# Patient Record
Sex: Female | Born: 1960 | Race: White | Hispanic: No | Marital: Married | Smoking: Current every day smoker
Health system: Southern US, Community
[De-identification: ages and names within clinical notes are randomized; demographics above are authoritative.]

## PROBLEM LIST (undated history)

## (undated) DIAGNOSIS — D069 Carcinoma in situ of cervix, unspecified: Secondary | ICD-10-CM

## (undated) DIAGNOSIS — I10 Essential (primary) hypertension: Secondary | ICD-10-CM

## (undated) DIAGNOSIS — K219 Gastro-esophageal reflux disease without esophagitis: Secondary | ICD-10-CM

## (undated) DIAGNOSIS — D649 Anemia, unspecified: Secondary | ICD-10-CM

## (undated) HISTORY — DX: Essential (primary) hypertension: I10

## (undated) HISTORY — DX: Carcinoma in situ of cervix, unspecified: D06.9

## (undated) HISTORY — PX: APPENDECTOMY: SHX54

## (undated) HISTORY — DX: Anemia, unspecified: D64.9

## (undated) HISTORY — DX: Gastro-esophageal reflux disease without esophagitis: K21.9

---

## 2008-07-26 ENCOUNTER — Emergency Department: Payer: Self-pay | Admitting: Internal Medicine

## 2009-10-29 ENCOUNTER — Emergency Department: Payer: Self-pay | Admitting: Emergency Medicine

## 2012-02-13 ENCOUNTER — Emergency Department: Payer: Self-pay | Admitting: Emergency Medicine

## 2012-02-13 LAB — BASIC METABOLIC PANEL
Calcium, Total: 8.8 mg/dL (ref 8.5–10.1)
Chloride: 108 mmol/L — ABNORMAL HIGH (ref 98–107)
Co2: 22 mmol/L (ref 21–32)
Creatinine: 0.79 mg/dL (ref 0.60–1.30)
EGFR (Non-African Amer.): >60
Potassium: 3.4 mmol/L — ABNORMAL LOW (ref 3.5–5.1)
Sodium: 142 mmol/L (ref 136–145)

## 2012-02-13 LAB — CBC
MCH: 28.1 pg (ref 26.0–34.0)
MCHC: 33.7 g/dL (ref 32.0–36.0)
MCV: 84 fL (ref 80–100)
Platelet: 162 10*3/uL (ref 150–440)
RBC: 4.47 10*6/uL (ref 3.80–5.20)
RDW: 16.2 % — ABNORMAL HIGH (ref 11.5–14.5)

## 2012-02-14 LAB — URINALYSIS, COMPLETE
Bilirubin,UR: NEGATIVE
Glucose,UR: NEGATIVE mg/dL (ref 0–75)
Nitrite: NEGATIVE
Ph: 6 (ref 4.5–8.0)
Protein: 30
WBC UR: 37 /HPF (ref 0–5)

## 2012-06-07 ENCOUNTER — Emergency Department: Payer: Self-pay | Admitting: Unknown Physician Specialty

## 2012-06-07 LAB — CBC
HCT: 36.2 % (ref 35.0–47.0)
HGB: 11.5 g/dL — ABNORMAL LOW (ref 12.0–16.0)
MCHC: 31.8 g/dL — ABNORMAL LOW (ref 32.0–36.0)
Platelet: 262 10*3/uL (ref 150–440)
RBC: 4.45 10*6/uL (ref 3.80–5.20)
RDW: 14.8 % — ABNORMAL HIGH (ref 11.5–14.5)
WBC: 11.6 10*3/uL — ABNORMAL HIGH (ref 3.6–11.0)

## 2012-06-07 LAB — BASIC METABOLIC PANEL
Anion Gap: 8 (ref 7–16)
Calcium, Total: 9.3 mg/dL (ref 8.5–10.1)
Chloride: 102 mmol/L (ref 98–107)
Creatinine: 0.87 mg/dL (ref 0.60–1.30)
EGFR (African American): >60
EGFR (Non-African Amer.): >60
Glucose: 96 mg/dL (ref 65–99)
Osmolality: 267 (ref 275–301)

## 2012-06-07 LAB — CK TOTAL AND CKMB (NOT AT ARMC): CK-MB: <0.5 ng/mL — ABNORMAL LOW (ref 0.5–3.6)

## 2012-07-06 ENCOUNTER — Ambulatory Visit (INDEPENDENT_AMBULATORY_CARE_PROVIDER_SITE_OTHER): Payer: Self-pay | Admitting: Family Medicine

## 2012-07-06 ENCOUNTER — Encounter: Payer: Self-pay | Admitting: Family Medicine

## 2012-07-06 VITALS — BP 140/92 | HR 77 | Temp 98.7°F | Ht 66.0 in | Wt 170.5 lb

## 2012-07-06 DIAGNOSIS — F410 Panic disorder [episodic paroxysmal anxiety] without agoraphobia: Secondary | ICD-10-CM

## 2012-07-06 DIAGNOSIS — I10 Essential (primary) hypertension: Secondary | ICD-10-CM | POA: Insufficient documentation

## 2012-07-06 DIAGNOSIS — F411 Generalized anxiety disorder: Secondary | ICD-10-CM

## 2012-07-06 DIAGNOSIS — G4700 Insomnia, unspecified: Secondary | ICD-10-CM

## 2012-07-06 DIAGNOSIS — F5104 Psychophysiologic insomnia: Secondary | ICD-10-CM | POA: Insufficient documentation

## 2012-07-06 MED ORDER — ATENOLOL 25 MG PO TABS: 25.0000 mg | ORAL_TABLET | Freq: Every day | ORAL | Status: DC

## 2012-07-06 MED ORDER — TRAZODONE HCL 50 MG PO TABS: 25.0000 mg | ORAL_TABLET | Freq: Every evening | ORAL | Status: DC | PRN

## 2012-07-06 NOTE — Progress Notes (Signed)
Subjective:    Patient ID: Carla Weaver, female    DOB: 1960-08-21, 52 y.o.   MRN: [id number]  HPI 52 year old female presents to establish.  Previously seeing Dr. Anner Crete in [place].  Has not had a complete physical in many years.  Has had labs in 05/2012 Total chol 155 LDL 100 HDl 36 Tri 131 A1c 5.7  Cr and LFTs nml  TSH nml Vit 31  Hypertension:  Inadequate control. Recent diagnosis in 03/2012 BP was  180/90. Using medication without problems or lightheadedness: Occ Chest pain with exertion:None Edema:None Short of breath:None Average home BPs:111/74- 144/80 Other issues: She feels that HCTZ is drying out. Frequent urination.  Lisinopril made her throat swell.  Insomnia, chronic : Ambien Anxiety and occ paic attacks. She has been working on  Relaxation and redirection and she has not had a panic attack in 2 weeks.  She feels she has been going through menopause in last few months, less heavy but still once a month. Review of Systems  Constitutional: Negative for fever, fatigue and unexpected weight change.  HENT: Negative for ear pain, congestion, sore throat, sneezing, trouble swallowing and sinus pressure.   Eyes: Negative for pain and itching.  Respiratory: Negative for cough, shortness of breath and wheezing.   Cardiovascular: Negative for chest pain, palpitations and leg swelling.  Gastrointestinal: Negative for nausea, abdominal pain, diarrhea, constipation and blood in stool.  Genitourinary: Negative for dysuria, hematuria, vaginal discharge, difficulty urinating and menstrual problem.  Skin: Negative for rash.  Neurological: Negative for syncope, weakness, light-headedness, numbness and headaches.  Psychiatric/Behavioral: Positive for sleep disturbance. Negative for suicidal ideas, confusion and dysphoric mood. The patient is nervous/anxious.        Objective:   Physical Exam  Constitutional: Vital signs are normal. She appears well-developed and  well-nourished. She is cooperative.  Non-toxic appearance. She does not appear ill. No distress.  HENT:  Head: Normocephalic.  Right Ear: Hearing, tympanic membrane, external ear and ear canal normal.  Left Ear: Hearing, tympanic membrane, external ear and ear canal normal.  Nose: Nose normal.  Eyes: Conjunctivae, EOM and lids are normal. Pupils are equal, round, and reactive to light. No foreign bodies found.  Neck: Trachea normal and normal range of motion. Neck supple. Carotid bruit is not present. No mass and no thyromegaly present.  Cardiovascular: Normal rate, regular rhythm, S1 normal, S2 normal, normal heart sounds and intact distal pulses.  Exam reveals no gallop.   No murmur heard. Pulmonary/Chest: Effort normal and breath sounds normal. No respiratory distress. She has no wheezes. She has no rhonchi. She has no rales.  Abdominal: Soft. Normal appearance and bowel sounds are normal. She exhibits no distension, no fluid wave, no abdominal bruit and no mass. There is no hepatosplenomegaly. There is no tenderness. There is no rebound, no guarding and no CVA tenderness. No hernia.  Genitourinary: Vagina normal and uterus normal. No breast swelling, tenderness, discharge or bleeding. Pelvic exam was performed with patient prone. There is no rash, tenderness or lesion on the right labia. There is no rash, tenderness or lesion on the left labia. Uterus is not enlarged and not tender. Cervix exhibits no motion tenderness, no discharge and no friability. Right adnexum displays no mass, no tenderness and no fullness. Left adnexum displays no mass, no tenderness and no fullness.  Lymphadenopathy:    She has no cervical adenopathy.    She has no axillary adenopathy.  Neurological: She is alert. She has normal strength. No  cranial nerve deficit or sensory deficit.  Skin: Skin is warm, dry and intact. No rash noted.  Psychiatric: Her speech is normal and behavior is normal. Judgment normal. Her mood  appears not anxious. Cognition and memory are normal. She does not exhibit a depressed mood.          Assessment & Plan:

## 2012-07-06 NOTE — Patient Instructions (Addendum)
Schedule CPX in next 1-2 months, no labs prior. Quit smoking. Work on regular exercise. Stop TV in bed. Healthy sleep hygiene. Start atenolol daily, stop HCTZ. Follow BP at home daily.. Call in 1 week with measurements. If after 1 week, you are still having insomnia: can try trazodone for sleep.

## 2012-07-08 NOTE — Assessment & Plan Note (Signed)
Discrete panic attacks.. No clear generalized anxiety per pt report, although insomnia may be due to anxiety.  She has been doing better with panic attacks using behavioral modification. Will hold off on anti-anxiolytic for now. Re-eval in 1 month.

## 2012-07-08 NOTE — Assessment & Plan Note (Signed)
Given SE to HCTZ. Will change to atenolol. Reviewed SE profile.  Follow BP at home. Encouraged exercise, weight loss, healthy eating habits. Encouraged her to quit smoking. She is precontemplative.

## 2012-07-08 NOTE — Assessment & Plan Note (Signed)
She feels this may partially be due to medicaiton. Will try stopping HCTZ. But if not improving try trial of trazodone. Sleep hygeine reviewed and info given.

## 2012-07-16 ENCOUNTER — Telehealth: Payer: Self-pay | Admitting: *Deleted

## 2012-07-16 MED ORDER — TRAZODONE HCL 50 MG PO TABS: 75.0000 mg | ORAL_TABLET | Freq: Every evening | ORAL | Status: DC | PRN

## 2012-07-16 NOTE — Telephone Encounter (Signed)
BP control looks great on atenelol.  We can have her try 75 -100 mg of trazodone at night.   If still having issues with sleep we will need to address possible anxiety issues as discussed at last OV.

## 2012-07-16 NOTE — Telephone Encounter (Signed)
Patient called with bp 131/76    125/72     114/61     107/68      123/64 Patient also asking if she can go up on her trazadone to 75 mg b/c she is still not sleeping

## 2012-07-16 NOTE — Telephone Encounter (Signed)
Sent!

## 2012-07-16 NOTE — Telephone Encounter (Signed)
Patient ask for another rx for the trazadone since she is going up

## 2012-09-02 ENCOUNTER — Other Ambulatory Visit (HOSPITAL_COMMUNITY)
Admission: RE | Admit: 2012-09-02 | Discharge: 2012-09-02 | Disposition: A | Discharge status: Home / Self Care | Payer: Self-pay | Source: Ambulatory Visit | Attending: Family Medicine | Admitting: Family Medicine

## 2012-09-02 ENCOUNTER — Encounter: Payer: Self-pay | Admitting: Family Medicine

## 2012-09-02 ENCOUNTER — Ambulatory Visit (INDEPENDENT_AMBULATORY_CARE_PROVIDER_SITE_OTHER): Payer: Self-pay | Admitting: Family Medicine

## 2012-09-02 VITALS — BP 140/72 | HR 64 | Temp 98.4°F | Ht 66.0 in | Wt 174.2 lb

## 2012-09-02 DIAGNOSIS — R87810 Cervical high risk human papillomavirus (HPV) DNA test positive: Secondary | ICD-10-CM | POA: Insufficient documentation

## 2012-09-02 DIAGNOSIS — I10 Essential (primary) hypertension: Secondary | ICD-10-CM

## 2012-09-02 DIAGNOSIS — R87811 Vaginal high risk human papillomavirus (HPV) DNA test positive: Secondary | ICD-10-CM

## 2012-09-02 DIAGNOSIS — IMO0002 Reserved for concepts with insufficient information to code with codable children: Secondary | ICD-10-CM

## 2012-09-02 DIAGNOSIS — Z01419 Encounter for gynecological examination (general) (routine) without abnormal findings: Secondary | ICD-10-CM | POA: Insufficient documentation

## 2012-09-02 DIAGNOSIS — Z1151 Encounter for screening for human papillomavirus (HPV): Secondary | ICD-10-CM | POA: Insufficient documentation

## 2012-09-02 DIAGNOSIS — Z0000 Encounter for general adult medical examination without abnormal findings: Secondary | ICD-10-CM

## 2012-09-02 DIAGNOSIS — F5104 Psychophysiologic insomnia: Secondary | ICD-10-CM

## 2012-09-02 DIAGNOSIS — G4700 Insomnia, unspecified: Secondary | ICD-10-CM

## 2012-09-02 DIAGNOSIS — F410 Panic disorder [episodic paroxysmal anxiety] without agoraphobia: Secondary | ICD-10-CM

## 2012-09-02 MED ORDER — ATENOLOL 25 MG PO TABS: 25.0000 mg | ORAL_TABLET | Freq: Every day | ORAL | Status: DC

## 2012-09-02 MED ORDER — TRAZODONE HCL 50 MG PO TABS: 75.0000 mg | ORAL_TABLET | Freq: Every evening | ORAL | Status: DC | PRN

## 2012-09-02 NOTE — Patient Instructions (Addendum)
Call to schedule mammogram on your own.  Follow up in 1 year for CPX.

## 2012-09-02 NOTE — Progress Notes (Signed)
Subjective:    Patient ID: Carla Weaver, female    DOB: 1960/06/18, 52 y.o.   MRN: [id number]  HPI  The patient is here for annual wellness exam and preventative care.     Hypertension: Improved control on atenolol  Using medication without problems or lightheadedness:  None Chest pain with exertion:None Edema:None Short of breath:None Average home BPs: 114/78-126/80 Other issues:  Panic attacks: No further panic attacks in last 1-2 month.  no depression. No SI.  Chronic insomnia: On trazodone for sleep at night, this medication is helping significantly.  Feels rested in mornings when she gets up.     Review of Systems  Constitutional: Negative for fever, fatigue and unexpected weight change.  HENT: Negative for ear pain, congestion, sore throat, sneezing, trouble swallowing and sinus pressure.   Eyes: Negative for pain and itching.  Respiratory: Negative for cough, shortness of breath and wheezing.   Cardiovascular: Negative for chest pain, palpitations and leg swelling.  Gastrointestinal: Negative for nausea, abdominal pain, diarrhea, constipation and blood in stool.  Genitourinary: Negative for dysuria, hematuria, vaginal discharge, difficulty urinating and menstrual problem.  Skin: Negative for rash.  Neurological: Negative for syncope, weakness, light-headedness, numbness and headaches.  Psychiatric/Behavioral: Negative for confusion and dysphoric mood. The patient is not nervous/anxious.        Objective:   Physical Exam  Constitutional: Vital signs are normal. She appears well-developed and well-nourished. She is cooperative.  Non-toxic appearance. She does not appear ill. No distress.  HENT:  Head: Normocephalic.  Right Ear: Hearing, tympanic membrane, external ear and ear canal normal.  Left Ear: Hearing, tympanic membrane, external ear and ear canal normal.  Nose: Nose normal.  Eyes: Conjunctivae, EOM and lids are normal. Pupils are equal, round, and  reactive to light. No foreign bodies found.  Neck: Trachea normal and normal range of motion. Neck supple. Carotid bruit is not present. No mass and no thyromegaly present.  Cardiovascular: Normal rate, regular rhythm, S1 normal, S2 normal, normal heart sounds and intact distal pulses.  Exam reveals no gallop.   No murmur heard. Pulmonary/Chest: Effort normal and breath sounds normal. No respiratory distress. She has no wheezes. She has no rhonchi. She has no rales.  Abdominal: Soft. Normal appearance and bowel sounds are normal. She exhibits no distension, no fluid wave, no abdominal bruit and no mass. There is no hepatosplenomegaly. There is no tenderness. There is no rebound, no guarding and no CVA tenderness. No hernia.  Genitourinary: Uterus normal. No breast swelling, tenderness, discharge or bleeding. Pelvic exam was performed with patient prone. There is no rash, tenderness or lesion on the right labia. There is no rash, tenderness or lesion on the left labia. Uterus is not enlarged and not tender. Cervix exhibits no motion tenderness, no discharge and no friability. Right adnexum displays no mass, no tenderness and no fullness. Left adnexum displays no mass, no tenderness and no fullness. There is bleeding around the vagina. No erythema or tenderness around the vagina. No signs of injury around the vagina.  Currently on menses, vaginal tissue was not well visualized given bleeding.  Lymphadenopathy:    She has no cervical adenopathy.    She has no axillary adenopathy.  Neurological: She is alert. She has normal strength. No cranial nerve deficit or sensory deficit.  Skin: Skin is warm, dry and intact. No rash noted.  Psychiatric: Her speech is normal and behavior is normal. Judgment normal. Her mood appears not anxious. Cognition and memory are normal.  She does not exhibit a depressed mood.          Assessment & Plan:  The patient's preventative maintenance and recommended screening  tests for an annual wellness exam were reviewed in full today. Brought up to date unless services declined.  Counselled on the importance of diet, exercise, and its role in overall health and mortality. The patient's FH and SH was reviewed, including their home life, tobacco status, and drug and alcohol status.    DVE/PAP: due, was last done longtime ago, but has always been nml. Mammo:overdue. Smoker, trying to quit.Marland Kitchen Has decreased to 5 cigs a day.  Plans to try E vapor cigs. No family history of early colon cancer.

## 2012-09-03 NOTE — Assessment & Plan Note (Signed)
Well controlled on trazodone. Refilled today.

## 2012-09-03 NOTE — Assessment & Plan Note (Signed)
Encouraged exercise, weight loss, healthy eating habits. Improved control on atenolol.

## 2012-09-03 NOTE — Assessment & Plan Note (Signed)
Resolved

## 2012-09-10 ENCOUNTER — Telehealth: Payer: Self-pay | Admitting: *Deleted

## 2012-09-10 MED ORDER — SERTRALINE HCL 50 MG PO TABS: 50.0000 mg | ORAL_TABLET | Freq: Every day | ORAL | Status: DC

## 2012-09-10 NOTE — Telephone Encounter (Signed)
Shes fine with it

## 2012-09-10 NOTE — Telephone Encounter (Signed)
I would recommend starting a medication like sertraline 50 mg daily for anxiety.. Takes 3-4 weeks to start working. SE initially in 1 week can be headache, upset stomach.Ma Hillock after getting used to med. Other SE can be decreased desire and possible weight gain... But no tim all people. Let me know if aggreable.. If she wants to discuss different options make appt to be seen.  Let me know if she wants to start sertraline...and  Make 1 month follow up anxiety .

## 2012-09-10 NOTE — Telephone Encounter (Signed)
Patient says that her anxiety is starting to get bad again and wants to know if anything can be called in for this? Please advise.

## 2012-09-13 ENCOUNTER — Encounter: Payer: Self-pay | Admitting: *Deleted

## 2012-09-16 DIAGNOSIS — IMO0002 Reserved for concepts with insufficient information to code with codable children: Secondary | ICD-10-CM | POA: Insufficient documentation

## 2012-09-16 NOTE — Assessment & Plan Note (Signed)
PAP returned high risk.. Pt referred for colposcopy. Called pt on 4/24 to discuss results. Answered all pt questions and explained diagnosis and expected eval/biopsy.

## 2012-09-16 NOTE — Addendum Note (Signed)
Addended by: Kerby Nora E on: 09/16/2012 05:25 PM   Modules accepted: Orders, Level of Service

## 2012-10-05 ENCOUNTER — Encounter: Payer: Self-pay | Admitting: Obstetrics and Gynecology

## 2012-10-12 ENCOUNTER — Ambulatory Visit (INDEPENDENT_AMBULATORY_CARE_PROVIDER_SITE_OTHER): Payer: Self-pay | Admitting: Obstetrics & Gynecology

## 2012-10-12 ENCOUNTER — Encounter: Payer: Self-pay | Admitting: Obstetrics & Gynecology

## 2012-10-12 VITALS — BP 143/69 | HR 55 | Ht 66.0 in | Wt 172.0 lb

## 2012-10-12 DIAGNOSIS — R87811 Vaginal high risk human papillomavirus (HPV) DNA test positive: Secondary | ICD-10-CM

## 2012-10-12 DIAGNOSIS — Z01812 Encounter for preprocedural laboratory examination: Secondary | ICD-10-CM

## 2012-10-12 DIAGNOSIS — IMO0002 Reserved for concepts with insufficient information to code with codable children: Secondary | ICD-10-CM

## 2012-10-12 LAB — POCT URINE PREGNANCY: Preg Test, Ur: NEGATIVE

## 2012-10-12 NOTE — Progress Notes (Signed)
GYNECOLOGY CLINIC PROCEDURE NOTE  52 y.o. No obstetric history on file. here for colposcopy for ASCUS with POSITIVE high risk HPV pap smear on 09/02/12.Marland Kitchen Discussed role for HPV in cervical dysplasia, need for surveillance.  Patient given informed consent, signed copy in the chart, time out was performed.  Placed in lithotomy position. Cervix viewed with speculum and colposcope after application of acetic acid.   Colposcopy adequate? 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  ECC specimen obtained. All specimens were labelled and sent to pathology.  Patient was given post procedure instructions.  Will follow up pathology and manage accordingly.  Routine preventative health maintenance measures emphasized.

## 2012-10-12 NOTE — Patient Instructions (Signed)

## 2012-10-12 NOTE — Progress Notes (Signed)
Here today for colposcopy, referred by Dr. Ermalene Searing for abnormal pap.

## 2012-10-13 ENCOUNTER — Encounter: Payer: Self-pay | Admitting: Obstetrics & Gynecology

## 2012-10-13 DIAGNOSIS — D069 Carcinoma in situ of cervix, unspecified: Secondary | ICD-10-CM | POA: Insufficient documentation

## 2012-10-13 HISTORY — DX: Carcinoma in situ of cervix, unspecified: D06.9

## 2012-10-14 ENCOUNTER — Other Ambulatory Visit: Payer: Self-pay | Admitting: *Deleted

## 2012-10-14 MED ORDER — TRAZODONE HCL 50 MG PO TABS: 75.0000 mg | ORAL_TABLET | Freq: Every evening | ORAL | Status: DC | PRN

## 2012-10-22 ENCOUNTER — Ambulatory Visit (INDEPENDENT_AMBULATORY_CARE_PROVIDER_SITE_OTHER): Payer: Self-pay | Admitting: Family Medicine

## 2012-10-22 DIAGNOSIS — R300 Dysuria: Secondary | ICD-10-CM

## 2012-10-22 LAB — POCT URINALYSIS DIPSTICK
Urobilinogen, UA: NEGATIVE
pH, UA: 7

## 2012-10-22 NOTE — Progress Notes (Signed)
Patient ID: Carla Weaver, female   DOB: 1960/12/03, 52 y.o.   MRN: [id number]   Patient complains for urinary frequency,dysuria,flank pain. Patient has tried to increase water,drink cranberry juice and also tried 2 over the counter medication with no relief

## 2012-10-25 ENCOUNTER — Telehealth: Payer: Self-pay | Admitting: *Deleted

## 2012-10-25 LAB — URINE CULTURE

## 2012-10-25 MED ORDER — CIPROFLOXACIN HCL 250 MG PO TABS: 250.0000 mg | ORAL_TABLET | Freq: Two times a day (BID) | ORAL | Status: DC

## 2012-10-25 NOTE — Telephone Encounter (Signed)
Message copied by Consuello Masse on Mon Oct 25, 2012  8:09 AM ------      Message from: [place], [place] E      Created: Sun Oct 24, 2012 10:57 PM       Notify pt that bacteria is [present i n urine.       Awaiting sensitivities. If sensitive to cipro... Go ahead and call in cipro 250 BID x 7 days. #14 0 RF.      If not sensitive.. I will address when sensitivities arrive. ------

## 2012-11-02 ENCOUNTER — Encounter: Payer: Self-pay | Admitting: Family Medicine

## 2012-11-02 ENCOUNTER — Ambulatory Visit (INDEPENDENT_AMBULATORY_CARE_PROVIDER_SITE_OTHER): Payer: Self-pay | Admitting: Family Medicine

## 2012-11-02 VITALS — BP 148/77 | HR 72 | Ht 66.0 in | Wt 175.0 lb

## 2012-11-02 DIAGNOSIS — R300 Dysuria: Secondary | ICD-10-CM

## 2012-11-02 DIAGNOSIS — Z01812 Encounter for preprocedural laboratory examination: Secondary | ICD-10-CM

## 2012-11-02 DIAGNOSIS — D069 Carcinoma in situ of cervix, unspecified: Secondary | ICD-10-CM

## 2012-11-02 LAB — POCT URINALYSIS DIPSTICK
Bilirubin, UA: NEGATIVE
Blood, UA: NEGATIVE
Glucose, UA: NEGATIVE
Nitrite, UA: NEGATIVE
pH, UA: 5

## 2012-11-02 NOTE — Patient Instructions (Signed)
Loop Electrosurgical Excision Procedure  Loop electrosurgical excision procedure (LEEP) is the removal of a portion of the lower part of the uterus (cervix). The procedure is done when there are significantly abnormal cervical cell changes. Abnormal cell changes of the cervix can lead to cancer if left in place and untreated.     The LEEP procedure itself typically only takes a few minutes. Often, it may be done in your caregiver's office. The procedure is considered safe for those who wish to get pregnant or are trying to get pregnant. Only under rare circumstances should this procedure be done if you are pregnant.  LET YOUR CAREGIVER KNOW ABOUT:  · Whether you are pregnant or late for your last menstrual period.  · Allergies to foods or medicines.  · All the medicines you are taking including herbs, eyedrops, and over-the-counter medicines, and creams.  · Use of steroids (by mouth or creams).  · Previous problems with anesthetics or numbing medicine.  · Previous gynecological surgery.  · History of blood clots or bleeding problems.  · Any recent or current vaginal infections (herpes, sexually transmitted infections).  · Other health problems.  RISKS AND COMPLICATIONS  · Bleeding.  · Infection.  · Injury to the vagina, bladder, or rectum.  · Very rare obstruction of the cervical opening that causes problems during menstruation (cervical stenosis).  BEFORE THE PROCEDURE  · Do not take aspirin or blood thinners (anticoagulants) for 1 week before the procedure, or as told by your caregiver.  · Eat a light meal before the procedure.  · Ask your caregiver about changing or stopping your regular medicines.  · You may be given a pain reliever 1 or 2 hours before the procedure.  PROCEDURE   · A tool (speculum) is placed in the vagina. This allows your caregiver to see the cervix.  · An iodine stain is applied to the cervix to find the area of abnormal cells to be removed.  · Medicine is injected to numb the cervix (local  anesthetic).    · Electricity is passed through a thin wire loop which is then used to remove (cauterize) a small segment of the affected cervix.  · Light electrocautery is used to seal any small blood vessels and prevent bleeding.  · A paste may be applied to the cauterized area of the cervix to help prevent bleeding.  · The tissue sample is sent to the lab. It is examined under the microscope.  AFTER THE PROCEDURE  · Have someone drive you home.  · You may have slight to moderate cramping.  · You may notice a black vaginal discharge from the paste used on the cervix to prevent bleeding. This is normal.  · Watch for excessive bleeding. This requires immediate medical care.  · Ask when your test results will be ready. Make sure you get your test results.  Document Released: 08/02/2002 Document Revised: 08/04/2011 Document Reviewed: 10/22/2010  [employer]® Patient Information ©2014 [employer].

## 2012-11-02 NOTE — Addendum Note (Signed)
Addended by: Pennie Banter on: 11/02/2012 01:05 PM   Modules accepted: Orders

## 2012-11-02 NOTE — Progress Notes (Signed)
Patient ID: Carla Weaver, female   DOB: 1960-12-29, 52 y.o.   MRN: [id number]  Patient identified, informed consent obtained, signed copy in chart, time out performed.  Pap smear and colposcopy reviewed.   Pap ASC-US + HR HPV Colpo Biopsy CIN 2-3  ECC CIN 2-3 Teflon coated speculum with smoke evacuator placed.  Cervix visualized. Paracervical block placed.  medium size Apple-Fisher LOOP used to remove cone of cervix using blend of cut and cautery on LEEP machine.  Edges/Base cauterized with Ball.  Monsel's solution used for hemostasis.  Patient tolerated procedure well.  Patient given post procedure instructions.  Follow up in 4 months for repeat pap or as needed.

## 2012-11-03 LAB — URINE CULTURE: Organism ID, Bacteria: NO GROWTH

## 2013-07-26 ENCOUNTER — Other Ambulatory Visit: Payer: Self-pay | Admitting: Family Medicine

## 2013-07-26 NOTE — Telephone Encounter (Signed)
Last office visit 0530/2014.  Ok to refill?

## 2013-08-29 ENCOUNTER — Telehealth: Payer: Self-pay | Admitting: Family Medicine

## 2013-08-29 ENCOUNTER — Other Ambulatory Visit (INDEPENDENT_AMBULATORY_CARE_PROVIDER_SITE_OTHER): Payer: 59

## 2013-08-29 DIAGNOSIS — I10 Essential (primary) hypertension: Secondary | ICD-10-CM

## 2013-08-29 LAB — LIPID PANEL
CHOL/HDL RATIO: 4
CHOLESTEROL: 173 mg/dL (ref 0–200)
HDL: 39.3 mg/dL (ref >39.00)
LDL CALC: 107 mg/dL — AB (ref 0–99)
Triglycerides: 132 mg/dL (ref 0.0–149.0)
VLDL: 26.4 mg/dL (ref 0.0–40.0)

## 2013-08-29 LAB — COMPREHENSIVE METABOLIC PANEL
ALK PHOS: 53 U/L (ref 39–117)
ALT: 21 U/L (ref 0–35)
AST: 20 U/L (ref 0–37)
Albumin: 3.7 g/dL (ref 3.5–5.2)
BUN: 14 mg/dL (ref 6–23)
CALCIUM: 8.9 mg/dL (ref 8.4–10.5)
CO2: 24 mEq/L (ref 19–32)
Chloride: 105 mEq/L (ref 96–112)
Creatinine, Ser: 0.9 mg/dL (ref 0.4–1.2)
GFR: 68.6 mL/min (ref >60.00)
Glucose, Bld: 85 mg/dL (ref 70–99)
POTASSIUM: 3.8 meq/L (ref 3.5–5.1)
SODIUM: 137 meq/L (ref 135–145)
TOTAL PROTEIN: 7.1 g/dL (ref 6.0–8.3)
Total Bilirubin: 0.4 mg/dL (ref 0.3–1.2)

## 2013-08-29 NOTE — Telephone Encounter (Signed)
Message copied by Jinny Sanders on Mon Aug 29, 2013  8:35 AM ------      Message from: Ellamae Sia      Created: Fri Aug 19, 2013 12:38 PM      Regarding: Lab orders for Monday 4.6.15       Patient is scheduled for CPX labs, please order future labs, Thanks , Terri       ------

## 2013-08-30 ENCOUNTER — Encounter: Payer: Self-pay | Admitting: *Deleted

## 2013-08-30 ENCOUNTER — Telehealth: Payer: Self-pay | Admitting: Family Medicine

## 2013-08-30 ENCOUNTER — Encounter: Payer: Self-pay | Admitting: Internal Medicine

## 2013-08-30 ENCOUNTER — Ambulatory Visit (INDEPENDENT_AMBULATORY_CARE_PROVIDER_SITE_OTHER): Payer: 59 | Admitting: Internal Medicine

## 2013-08-30 VITALS — BP 144/80 | HR 61 | Temp 97.9°F | Wt 182.0 lb

## 2013-08-30 DIAGNOSIS — N898 Other specified noninflammatory disorders of vagina: Secondary | ICD-10-CM

## 2013-08-30 DIAGNOSIS — N899 Noninflammatory disorder of vagina, unspecified: Secondary | ICD-10-CM

## 2013-08-30 NOTE — Patient Instructions (Addendum)

## 2013-08-30 NOTE — Telephone Encounter (Signed)
Relevant patient education mailed to patient.  

## 2013-08-30 NOTE — Progress Notes (Signed)
Pre visit review using our clinic review tool, if applicable. No additional management support is needed unless otherwise documented below in the visit note. 

## 2013-08-30 NOTE — Progress Notes (Signed)
Subjective:    Patient ID: Carla Weaver, female    DOB: 26-Jul-1960, 53 y.o.   MRN: [id number]  HPI  Pt presents to the clinic today with c/o vaginal discharge and irritation. She noticed this 1 week ago. She describes the discharge as thin and clear. She has noticed some burning on her labia. She denies itching. She does not have any new sexual partners.  Review of Systems      Past Medical History  Diagnosis Date  . Hypertension   . Anemia   . GERD (gastroesophageal reflux disease)   . 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 atenolol (TENORMIN) 25 MG tablet Take 1 tablet (25 mg total) by mouth daily.  90 tablet  3  . traZODone (DESYREL) 50 MG tablet TAKE ONE & ONE-HALF TO TWO TABLETS BY MOUTH AT BEDTIME AS NEEDED FOR SLEEP  60 tablet  0   No current facility-administered medications for this visit.    Allergies  Allergen Reactions  . Sulfa Antibiotics     Family History  Problem Relation Age of Onset  . Hypertension Mother   . Arthritis Mother   . Hypertension Father   . Vision loss Father     galucoma  . Hypertension Sister   . Alcohol abuse Brother   . Cancer Maternal Aunt 33    breast cancer    History   Social History  . Marital Status: Married    Spouse Name: N/A    Number of Children: N/A  . Years of Education: N/A   Occupational History  . Not on file.   Social History Main Topics  . Smoking status: Current Every Day Smoker -- 1.00 packs/day for 34 years    Types: Cigarettes  . Smokeless tobacco: Not on file  . Alcohol Use: No  . Drug Use: No  . Sexual Activity: Not on file   Other Topics Concern  . 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   Exercise: At work.     Diet: decreased portion.    Increased water.           Constitutional: Denies fever, malaise, fatigue, headache or abrupt weight  changes.  GU: Pt reports burning sensation on labia. Denies urgency, frequency, pain with urination, burning sensation, blood in urine, odor or discharge.   No other specific complaints in a complete review of systems (except as listed in HPI above).  Objective:   Physical Exam  BP 144/80  Pulse 61  Temp(Src) 97.9 F (36.6 C) (Oral)  Wt 182 lb (82.555 kg)  SpO2 98% Wt Readings from Last 3 Encounters:  08/30/13 182 lb (82.555 kg)  11/02/12 175 lb (79.379 kg)  10/12/12 172 lb (78.019 kg)    General: Appears her stated age, obese but well developed, well nourished in NAD. Cardiovascular: Normal rate and rhythm. S1,S2 noted.  No murmur, rubs or gallops noted. No JVD or BLE edema. No carotid bruits noted. Pulmonary/Chest: Normal effort and positive vesicular breath sounds. No respiratory distress. No wheezes, rales or ronchi noted.  Abdomen: Soft and nontender. Normal bowel sounds, no bruits noted. No distention or masses noted. Liver, spleen and kidneys non palpable. No CVA tenderness. Pelvic: No redness or erythema noted. No vaginal discharge noted.   BMET    Component Value Date/Time  NA 137 08/29/2013 1417   K 3.8 08/29/2013 1417   CL 105 08/29/2013 1417   CO2 24 08/29/2013 1417   GLUCOSE 85 08/29/2013 1417   BUN 14 08/29/2013 1417   CREATININE 0.9 08/29/2013 1417   CALCIUM 8.9 08/29/2013 1417    Lipid Panel     Component Value Date/Time   CHOL 173 08/29/2013 1417   TRIG 132.0 08/29/2013 1417   HDL 39.30 08/29/2013 1417   CHOLHDL 4 08/29/2013 1417   VLDL 26.4 08/29/2013 1417   LDLCALC 107* 08/29/2013 1417           Assessment & Plan:   Vaginal irritation:  ? Due to her soap Asked her to try dove sensitive Avoid bubble baths Urinalysis: normal Wet Prep: neg for trich, yeast or clue cells  RTC as needed

## 2013-09-01 ENCOUNTER — Encounter: Payer: Self-pay | Admitting: Family Medicine

## 2013-09-01 ENCOUNTER — Ambulatory Visit (INDEPENDENT_AMBULATORY_CARE_PROVIDER_SITE_OTHER): Payer: 59 | Admitting: Family Medicine

## 2013-09-01 ENCOUNTER — Other Ambulatory Visit (HOSPITAL_COMMUNITY)
Admission: RE | Admit: 2013-09-01 | Discharge: 2013-09-01 | Disposition: A | Discharge status: Home / Self Care | Payer: 59 | Source: Ambulatory Visit | Attending: Family Medicine | Admitting: Family Medicine

## 2013-09-01 VITALS — BP 140/80 | HR 68 | Temp 98.2°F | Ht 66.5 in | Wt 180.0 lb

## 2013-09-01 DIAGNOSIS — Z01419 Encounter for gynecological examination (general) (routine) without abnormal findings: Secondary | ICD-10-CM | POA: Insufficient documentation

## 2013-09-01 DIAGNOSIS — Z1211 Encounter for screening for malignant neoplasm of colon: Secondary | ICD-10-CM

## 2013-09-01 DIAGNOSIS — F410 Panic disorder [episodic paroxysmal anxiety] without agoraphobia: Secondary | ICD-10-CM

## 2013-09-01 DIAGNOSIS — IMO0002 Reserved for concepts with insufficient information to code with codable children: Secondary | ICD-10-CM

## 2013-09-01 DIAGNOSIS — F5104 Psychophysiologic insomnia: Secondary | ICD-10-CM

## 2013-09-01 DIAGNOSIS — I10 Essential (primary) hypertension: Secondary | ICD-10-CM

## 2013-09-01 DIAGNOSIS — Z0000 Encounter for general adult medical examination without abnormal findings: Secondary | ICD-10-CM

## 2013-09-01 DIAGNOSIS — N899 Noninflammatory disorder of vagina, unspecified: Secondary | ICD-10-CM

## 2013-09-01 DIAGNOSIS — R6889 Other general symptoms and signs: Secondary | ICD-10-CM

## 2013-09-01 DIAGNOSIS — G4700 Insomnia, unspecified: Secondary | ICD-10-CM

## 2013-09-01 DIAGNOSIS — Z1151 Encounter for screening for human papillomavirus (HPV): Secondary | ICD-10-CM | POA: Insufficient documentation

## 2013-09-01 DIAGNOSIS — D069 Carcinoma in situ of cervix, unspecified: Secondary | ICD-10-CM

## 2013-09-01 DIAGNOSIS — N898 Other specified noninflammatory disorders of vagina: Secondary | ICD-10-CM

## 2013-09-01 MED ORDER — ALPRAZOLAM 0.25 MG PO TABS: 0.2500 mg | ORAL_TABLET | Freq: Every evening | ORAL | Status: DC | PRN

## 2013-09-01 MED ORDER — ATENOLOL 25 MG PO TABS: 25.0000 mg | ORAL_TABLET | Freq: Every day | ORAL | Status: DC

## 2013-09-01 NOTE — Patient Instructions (Addendum)
Start alprazolam at night for sleep, can use occasionally during the day for panic attacks. Limit use as able. Follow up mood in 1 month. Call to schedule mammogram on your own.  Stop at front desk to set up Gu referral for  Colonoscopy.

## 2013-09-01 NOTE — Progress Notes (Signed)
The patient is here for annual wellness exam and preventative care.   Saw  Cecille Po on 4/7. Vaginal irritation externally. Occ thin clear discharge. Urinalysis: normal  Wet Prep: neg for trich, yeast or clue cells.  She is no better now.   Hypertension: Improved control on atenolol  BP Readings from Last 3 Encounters:  09/01/13 140/80  08/30/13 144/80  11/02/12 148/77  Using medication without problems or lightheadedness: None  Chest pain with exertion:None  Edema:None  Short of breath:None  Average home BPs: 116-124/73-78 HR 64 Other issues:   Panic attacks: She has been having 2 panic attacks a week.  Some increase in stress.. Sister with ? Kidney cancer, father with kidney disease due HTN. She has  No depression.  No SI.   Chronic insomnia: On trazodone for sleep at night, this medication was helping, no longer. Wakes up after 2 hours.  If she takes a higher dose of trazodone it last  Too long.. Feels sdedated in mornings when she gets up.   Review of Systems  Constitutional: Negative for fever, fatigue and unexpected weight change.  HENT: Negative for ear pain, congestion, sore throat, sneezing, trouble swallowing and sinus pressure.  Eyes: Negative for pain and itching.  Respiratory: Negative for cough, shortness of breath and wheezing.  Cardiovascular: Negative for chest pain, palpitations and leg swelling.  Gastrointestinal: Negative for nausea, abdominal pain, diarrhea, constipation and blood in stool.  Genitourinary: Negative for dysuria, hematuria, vaginal discharge, difficulty urinating and menstrual problem.  Skin: Negative for rash.  Neurological: Negative for syncope, weakness, light-headedness, numbness and headaches.  Psychiatric/Behavioral: Negative for confusion and dysphoric mood. The patient is not nervous/anxious.  NO SI., no HI. Objective:   Physical Exam  Constitutional: Vital signs are normal. She appears well-developed and well-nourished. She is  cooperative. Non-toxic appearance. She does not appear ill. No distress.  HENT:  Head: Normocephalic.  Right Ear: Hearing, tympanic membrane, external ear and ear canal normal.  Left Ear: Hearing, tympanic membrane, external ear and ear canal normal.  Nose: Nose normal.  Eyes: Conjunctivae, EOM and lids are normal. Pupils are equal, round, and reactive to light. No foreign bodies found.  Neck: Trachea normal and normal range of motion. Neck supple. Carotid bruit is not present. No mass and no thyromegaly present.  Cardiovascular: Normal rate, regular rhythm, S1 normal, S2 normal, normal heart sounds and intact distal pulses. Exam reveals no gallop.  No murmur heard.  Pulmonary/Chest: Effort normal and breath sounds normal. No respiratory distress. She has no wheezes. She has no rhonchi. She has no rales.  Abdominal: Soft. Normal appearance and bowel sounds are normal. She exhibits no distension, no fluid wave, no abdominal bruit and no mass. There is no hepatosplenomegaly. There is no tenderness. There is no rebound, no guarding and no CVA tenderness. No hernia.  Genitourinary: Uterus normal. No breast swelling, tenderness, discharge or bleeding. Pelvic exam was performed with patient prone. There is no rash, tenderness or lesion on the right labia. There is no rash, tenderness or lesion on the left labia. Uterus is not enlarged and not tender. Cervix exhibits no motion tenderness, no discharge and no friability. Right adnexum displays no mass, no tenderness and no fullness. Left adnexum displays no mass, no tenderness and no fullness. No erythema or tenderness around the vagina. No signs of injury around the vagina. Lymphadenopathy:  She has no cervical adenopathy.  She has no axillary adenopathy.  Neurological: She is alert. She has normal strength. No  cranial nerve deficit or sensory deficit.  Skin: Skin is warm, dry and intact. No rash noted.  Psychiatric: Her speech is normal and behavior is  normal. Judgment normal. Her mood appears not anxious. Cognition and memory are normal. She does not exhibit a depressed mood.  Assessment & Plan:   The patient's preventative maintenance and recommended screening tests for an annual wellness exam were reviewed in full today.  Brought up to date unless services declined.  Counselled on the importance of diet, exercise, and its role in overall health and mortality.  The patient's FH and SH was reviewed, including their home life, tobacco status, and drug and alcohol status.   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 " They told me they got it all"  No follow up recommended. Mammo:overdue.  Smoker, trying to quit.Marland Kitchen Has decreased to 3 cigs a day. Considered trying E vapor cigs.  colon cancer screen: due

## 2013-09-01 NOTE — Assessment & Plan Note (Signed)
No sign of infection. Neg wet prep 2 days ago. Not post menopausal. Likely irritant derm.Marland Kitchen Possibly due to detergent change.

## 2013-09-01 NOTE — Assessment & Plan Note (Signed)
Stop trazodone, will use alprazolam for sleep. Follow up in 1 month.

## 2013-09-01 NOTE — Assessment & Plan Note (Signed)
Hopefully situation exacerbation.  Temporary increase in alprazolam.

## 2013-09-01 NOTE — Assessment & Plan Note (Signed)
Well controlled at home.

## 2013-09-01 NOTE — Progress Notes (Signed)
Pre visit review using our clinic review tool, if applicable. No additional management support is needed unless otherwise documented below in the visit note. 

## 2013-09-06 ENCOUNTER — Encounter: Payer: Self-pay | Admitting: *Deleted

## 2013-09-29 ENCOUNTER — Encounter: Payer: Self-pay | Admitting: Internal Medicine

## 2013-09-30 ENCOUNTER — Ambulatory Visit: Payer: Self-pay | Admitting: Family Medicine

## 2013-11-08 ENCOUNTER — Telehealth: Payer: Self-pay | Admitting: Internal Medicine

## 2013-11-08 ENCOUNTER — Ambulatory Visit (AMBULATORY_SURGERY_CENTER): Payer: 59

## 2013-11-08 VITALS — Ht 66.0 in | Wt 184.4 lb

## 2013-11-08 DIAGNOSIS — Z1211 Encounter for screening for malignant neoplasm of colon: Secondary | ICD-10-CM

## 2013-11-08 MED ORDER — NA SULFATE-K SULFATE-MG SULF 17.5-3.13-1.6 GM/177ML PO SOLN: 1.0000 | Freq: Once | ORAL | Status: DC

## 2013-11-08 MED ORDER — SUPREP BOWEL PREP KIT 17.5-3.13-1.6 GM/177ML PO SOLN: 1.0000 | Freq: Once | ORAL | Status: DC

## 2013-11-08 NOTE — Telephone Encounter (Signed)
Pt will come by [facility] 4th floor and pick up sample of Suprep.

## 2013-11-08 NOTE — Progress Notes (Signed)
FYI pt has "tilted colon".  Was told following C-section.  No allergies to eggs or soy No home oxygen No diet/weight loss meds No past problems with anesthesia  No email

## 2013-11-22 ENCOUNTER — Encounter: Payer: Self-pay | Admitting: Internal Medicine

## 2013-11-22 ENCOUNTER — Ambulatory Visit (AMBULATORY_SURGERY_CENTER): Payer: 59 | Admitting: Internal Medicine

## 2013-11-22 VITALS — BP 129/75 | HR 47 | Temp 97.0°F | Resp 16 | Ht 66.0 in | Wt 184.0 lb

## 2013-11-22 DIAGNOSIS — D126 Benign neoplasm of colon, unspecified: Secondary | ICD-10-CM

## 2013-11-22 DIAGNOSIS — Z1211 Encounter for screening for malignant neoplasm of colon: Secondary | ICD-10-CM

## 2013-11-22 DIAGNOSIS — D129 Benign neoplasm of anus and anal canal: Secondary | ICD-10-CM

## 2013-11-22 DIAGNOSIS — D128 Benign neoplasm of rectum: Secondary | ICD-10-CM

## 2013-11-22 MED ORDER — SODIUM CHLORIDE 0.9 % IV SOLN: 500.0000 mL | INTRAVENOUS | Status: DC

## 2013-11-22 NOTE — Progress Notes (Signed)
Called to room to assist during endoscopic procedure.  Patient ID and intended procedure confirmed with present staff. Received instructions for my participation in the procedure from the performing physician.  

## 2013-11-22 NOTE — Patient Instructions (Addendum)
I found and removed 9 small polyps. They all look benign. Will see if they are pre-cancerous. 1 of the 9 did not get picked up.  I will let you know pathology results and when to have another routine colonoscopy by mail.  I appreciate the opportunity to care for you. Gatha Mayer, MD, FACG   YOU HAD AN ENDOSCOPIC PROCEDURE TODAY AT [facility]: Refer to the procedure report that was given to you for any specific questions about what was found during the examination.  If the procedure report does not answer your questions, please call your gastroenterologist to clarify.  If you requested that your care partner not be given the details of your procedure findings, then the procedure report has been included in a sealed envelope for you to review at your convenience later.  YOU SHOULD EXPECT: Some feelings of bloating in the abdomen. Passage of more gas than usual.  Walking can help get rid of the air that was put into your GI tract during the procedure and reduce the bloating. If you had a lower endoscopy (such as a colonoscopy or flexible sigmoidoscopy) you may notice spotting of blood in your stool or on the toilet paper. If you underwent a bowel prep for your procedure, then you may not have a normal bowel movement for a few days.  DIET: Your first meal following the procedure should be a light meal and then it is ok to progress to your normal diet.  A half-sandwich or bowl of soup is an example of a good first meal.  Heavy or fried foods are harder to digest and may make you feel nauseous or bloated.  Likewise meals heavy in dairy and vegetables can cause extra gas to form and this can also increase the bloating.  Drink plenty of fluids but you should avoid alcoholic beverages for 24 hours.  ACTIVITY: Your care partner should take you home directly after the procedure.  You should plan to take it easy, moving slowly for the rest of the day.  You can resume normal activity the day  after the procedure however you should NOT DRIVE or use heavy machinery for 24 hours (because of the sedation medicines used during the test).    SYMPTOMS TO REPORT IMMEDIATELY: A gastroenterologist can be reached at any hour.  During normal business hours, 8:30 AM to 5:00 PM Monday through Friday, call [phone].  After hours and on weekends, please call the GI answering service at [phone] who will take a message and have the physician on call contact you.   Following lower endoscopy (colonoscopy or flexible sigmoidoscopy):  Excessive amounts of blood in the stool  Significant tenderness or worsening of abdominal pains  Swelling of the abdomen that is new, acute  Fever of 100F or higher  FOLLOW UP: If any biopsies were taken you will be contacted by phone or by letter within the next 1-3 weeks.  Call your gastroenterologist if you have not heard about the biopsies in 3 weeks.  Our staff will call the home number listed on your records the next business day following your procedure to check on you and address any questions or concerns that you may have at that time regarding the information given to you following your procedure. This is a courtesy call and so if there is no answer at the home number and we have not heard from you through the emergency physician on call, we will assume that you  have returned to your regular daily activities without incident.  SIGNATURES/CONFIDENTIALITY: You and/or your care partner have signed paperwork which will be entered into your electronic medical record.  These signatures attest to the fact that that the information above on your After Visit Summary has been reviewed and is understood.  Full responsibility of the confidentiality of this discharge information lies with you and/or your care-partner.

## 2013-11-22 NOTE — Progress Notes (Signed)
A/ox3 pleased with MAC, report to Karen RN 

## 2013-11-22 NOTE — Op Note (Signed)
[address] [place], [id number]   COLONOSCOPY PROCEDURE REPORT  PATIENT: Carla Weaver, Carla Weaver  MR#: [id number] BIRTHDATE: 02/14/1961 , 52  yrs. old GENDER: Female ENDOSCOPIST: Gatha Mayer, MD, [facility] REFERRED BY:Amy Cletis [place], M.D. PROCEDURE DATE:  11/22/2013 PROCEDURE:   Colonoscopy with biopsy and snare polypectomy First Screening Colonoscopy - Avg.  risk and is 50 yrs.  old or older Yes.  Prior Negative Screening - Now for repeat screening. N/A  History of Adenoma - Now for follow-up colonoscopy & has been > or = to 3 yrs.  N/A  Polyps Removed Today? Yes. ASA CLASS:   Class II INDICATIONS:average risk screening and first colonoscopy. MEDICATIONS: propofol (Diprivan) 300mg  IV, MAC sedation, administered by CRNA, and These medications were titrated to patient response per physician's verbal order  DESCRIPTION OF PROCEDURE:   After the risks benefits and alternatives of the procedure were thoroughly explained, informed consent was obtained.  A digital rectal exam revealed no abnormalities of the rectum.   The LB [id number]  endoscope was introduced through the anus and advanced to the cecum, which was identified by both the appendix and ileocecal valve. No adverse events experienced.   The quality of the prep was excellent using Suprep  The instrument was then slowly withdrawn as the colon was fully examined.  COLON FINDINGS: Nine diminutive sessile polyps were found at the cecum, in the transverse colon, descending colon, sigmoid colon, and rectum.  A polypectomy was performed with cold forceps and with a cold snare - (either)  The resection was complete and the polyp tissue was partially retrieved.  8 of 9 polyps removed. The colon mucosa was otherwise normal.   A right colon retroflexion was performed.  Retroflexed views revealed no abnormalities. The time to cecum=1 minutes 21 seconds.  Withdrawal time=16 minutes 06 seconds.  The scope was  withdrawn and the procedure completed. COMPLICATIONS: There were no complications.     ENDOSCOPIC IMPRESSION: 1.   Nine diminutive sessile polyps were found at the cecum, in the transverse colon, descending colon, sigmoid colon, and rectum; polypectomy was performed with cold forceps and with a cold snare 1 of the 9 (rectual) polyps was not recovered. 2.   The colon mucosa was otherwise normal - excellent prep - first colonoscopy  RECOMMENDATIONS: Timing of repeat colonoscopy will be determined by pathology findings.   eSigned:  Gatha Mayer, MD, [facility] 11/22/2013 8:44 AM   cc: Jinny Sanders, MD and The Patient   PATIENT NAME:  Carla Weaver, Carla Weaver MR#: [id number]

## 2013-11-23 ENCOUNTER — Telehealth: Payer: Self-pay | Admitting: *Deleted

## 2013-11-23 NOTE — Telephone Encounter (Signed)
  Follow up Call-  Call back number 11/22/2013  Post procedure Call Back phone  # [phone]  Permission to leave phone message Yes     Patient questions:  Do you have a fever, pain , or abdominal swelling? No. Pain Score  0 *  Have you tolerated food without any problems? Yes.    Have you been able to return to your normal activities? Yes.    Do you have any questions about your discharge instructions: Diet   No. Medications  No. Follow up visit  No.  Do you have questions or concerns about your Care? No.  Actions: * If pain score is 4 or above: No action needed, pain <4.

## 2013-11-24 ENCOUNTER — Other Ambulatory Visit: Payer: Self-pay | Admitting: Family Medicine

## 2013-11-24 NOTE — Telephone Encounter (Signed)
Last office visit 09/01/2013.  Not on current medication list.  Refill?

## 2013-11-30 ENCOUNTER — Telehealth: Payer: Self-pay | Admitting: Internal Medicine

## 2013-11-30 NOTE — Telephone Encounter (Signed)
Patient notified that she will be notified by mail of the results of the pathology as soon as Dr. Carlean Purl has reviewed

## 2013-11-30 NOTE — Telephone Encounter (Signed)
Error---pt has 2 MRN. Wrong chart

## 2013-12-01 ENCOUNTER — Encounter: Payer: Self-pay | Admitting: Internal Medicine

## 2014-01-09 ENCOUNTER — Encounter: Payer: Self-pay | Admitting: Family Medicine

## 2014-01-09 ENCOUNTER — Ambulatory Visit (INDEPENDENT_AMBULATORY_CARE_PROVIDER_SITE_OTHER)
Admission: RE | Admit: 2014-01-09 | Discharge: 2014-01-09 | Disposition: A | Discharge status: Home / Self Care | Payer: 59 | Source: Ambulatory Visit | Attending: Family Medicine | Admitting: Family Medicine

## 2014-01-09 ENCOUNTER — Ambulatory Visit (INDEPENDENT_AMBULATORY_CARE_PROVIDER_SITE_OTHER): Payer: 59 | Admitting: Family Medicine

## 2014-01-09 ENCOUNTER — Encounter (INDEPENDENT_AMBULATORY_CARE_PROVIDER_SITE_OTHER): Payer: Self-pay

## 2014-01-09 VITALS — BP 138/74 | HR 62 | Temp 98.4°F | Ht 66.5 in | Wt 183.0 lb

## 2014-01-09 DIAGNOSIS — M25559 Pain in unspecified hip: Secondary | ICD-10-CM

## 2014-01-09 DIAGNOSIS — M25552 Pain in left hip: Secondary | ICD-10-CM

## 2014-01-09 DIAGNOSIS — M5416 Radiculopathy, lumbar region: Secondary | ICD-10-CM

## 2014-01-09 DIAGNOSIS — IMO0002 Reserved for concepts with insufficient information to code with codable children: Secondary | ICD-10-CM

## 2014-01-09 MED ORDER — CYCLOBENZAPRINE HCL 10 MG PO TABS: 5.0000 mg | ORAL_TABLET | Freq: Every day | ORAL | Status: DC

## 2014-01-09 MED ORDER — HYDROCODONE-ACETAMINOPHEN 5-325 MG PO TABS: 1.0000 | ORAL_TABLET | Freq: Four times a day (QID) | ORAL | Status: DC | PRN

## 2014-01-09 MED ORDER — PREDNISONE 20 MG PO TABS: ORAL_TABLET | ORAL | Status: DC

## 2014-01-09 NOTE — Progress Notes (Signed)
01/09/2014    ID: Onna Nodal   MRN: [id number]  DOB: 1961/04/17  Primary Physician:  Eliezer Lofts, MD  Chief Complaint: hip and leg pain  Subjective:   History of Present Illness:  Carla Weaver is a 53 y.o. very pleasant female patient who presents with the following: Back Pain  ongoing for approximately: 6 weeks The patient has had back pain before. The back pain is localized into the lumbar spine area. They also describe L radiculopathy and "hip pain" in the posterior. Unclear if groin pain.  6 weeks. Tylenol, icy hot, heating pad, Capsaicin patches.  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  No numbness or tingling. No bowel or bladder incontinence. No focal weakness. Prior interventions: none Physical therapy: No Chiropractic manipulations: No Acupuncture: No Osteopathic manipulation: No Heat or cold: Minimal effect  Past Medical History, Surgical History, Family History, Medications, Allergies have been reviewed and updated if relevant.  Review of Systems  GEN: No fevers, chills. Nontoxic. Primarily MSK c/o today. MSK: Detailed in the HPI GI: tolerating PO intake without difficulty Neuro: As above  Otherwise the pertinent positives of the ROS are noted above.     Objective:   Physical Exam Filed Vitals:   01/09/14 1407  BP: 138/74  Pulse: 62  Temp: 98.4 F (36.9 C)  TempSrc: Oral  Height: 5' 6.5" (1.689 m)  Weight: 183 lb (83.008 kg)    Gen: Well-developed,well-nourished,in no acute distress; alert,appropriate and cooperative throughout examination HEENT: Normocephalic and atraumatic without obvious abnormalities.  Ears, externally no deformities Pulm: Breathing comfortably in no respiratory distress Range of motion at  the waist: Flexion, rotation and lateral bending: notably restricted in all directions. She has a ways to 50 percent loss of motion in all places.  No echymosis or edema Rises to examination table with no  difficulty Gait: minimally antalgic  Inspection/Deformity: No abnormality Paraspinus T:  Tender to palpation from L2-S1.  B Ankle Dorsiflexion (L5,4): 5/5 B Great Toe Dorsiflexion (L5,4): 5/5 Heel Walk (L5): WNL Toe Walk (S1): WNL Rise/Squat (L4): WNL, mild pain  SENSORY B Medial Foot (L4): WNL B Dorsum (L5): WNL B Lateral (S1): WNL Light Touch: WNL Pinprick: WNL  REFLEXES Knee (L4): 2+ Ankle (S1): 2+  B SLR, seated: POS B SLR, supine: POS B FABER: neg B Reverse FABER: neg B Greater Troch: NT B Log Roll: neg B Stork: NT B Sciatic Notch: NT  Radiology: Dg Lumbar Spine Complete  01/09/2014   CLINICAL DATA:  Back pain.  EXAM: LUMBAR SPINE - COMPLETE 4+ VIEW  COMPARISON:  None.  FINDINGS: No fracture or spondylolisthesis. Mild loss of disc height at L3-L4 with moderate loss of disc height at L4-L5. Remaining lumbar disc spaces are well preserved. Mild facet degenerative changes noted bilaterally at L4-L5 and L5-S1.  Soft tissues are unremarkable.  IMPRESSION: No fracture or acute finding.  Mild degenerative changes.   Electronically Signed   By: Lajean Manes M.D.   On: 01/09/2014 15:18   Dg Hip Complete Left  01/09/2014   CLINICAL DATA:  Hip versus back pain.  Poor range of motion.  EXAM: LEFT HIP - COMPLETE 2+ VIEW  COMPARISON:  None.  FINDINGS: There is no evidence of hip fracture or dislocation. There is no evidence of arthropathy or other focal bone abnormality.  IMPRESSION: Negative.   Electronically Signed   By: Shon Hale M.D.   On: 01/09/2014 15:25    Assessment & Plan:   Lumbar radiculopathy,  acute - Plan: DG Lumbar Spine Complete, DG Hip Complete Left  Left hip pain - Plan: DG Lumbar Spine Complete, DG Hip Complete Left  Probable acute disc herniation. Considerable pain.  Steroids, flexeril at night prn vicodin.   F/u 4 weeks if not fully better  Follow-up: No Follow-up on file.  New Prescriptions   CYCLOBENZAPRINE (FLEXERIL) 10 MG TABLET    Take 0.5-1  tablets (5-10 mg total) by mouth at bedtime.   HYDROCODONE-ACETAMINOPHEN (NORCO/VICODIN) 5-325 MG PER TABLET    Take 1 tablet by mouth every 6 (six) hours as needed for moderate pain.   PREDNISONE (DELTASONE) 20 MG TABLET    2 tab po for 7 days, then 1 tab po for 7 days   Orders Placed This Encounter  Procedures  . DG Lumbar Spine Complete  . DG Hip Complete Left    Signed,  Frederico Hamman T. Merton Wadlow, MD, [facility] [place], [address] Phone: [phone] Fax: [phone]   Patient's Medications  New Prescriptions   CYCLOBENZAPRINE (FLEXERIL) 10 MG TABLET    Take 0.5-1 tablets (5-10 mg total) by mouth at bedtime.   HYDROCODONE-ACETAMINOPHEN (NORCO/VICODIN) 5-325 MG PER TABLET    Take 1 tablet by mouth every 6 (six) hours as needed for moderate pain.   PREDNISONE (DELTASONE) 20 MG TABLET    2 tab po for 7 days, then 1 tab po for 7 days  Previous Medications   ACETAMINOPHEN (TYLENOL) 500 MG TABLET    Take 500 mg by mouth 2 (two) times daily as needed.   ALPRAZOLAM (XANAX) 0.25 MG TABLET    TAKE ONE TABLET BY MOUTH AT BEDTIME AS NEEDED FOR ANXIETY( AND CAN USE OCCASSIONALLY DURING THE DAY FOR PANICK ATTACKS)   ATENOLOL (TENORMIN) 25 MG TABLET    Take 1 tablet (25 mg total) by mouth daily.  Modified Medications   No medications on file  Discontinued Medications   No medications on file

## 2014-01-09 NOTE — Progress Notes (Signed)
Pre visit review using our clinic review tool, if applicable. No additional management support is needed unless otherwise documented below in the visit note. 

## 2014-02-06 ENCOUNTER — Telehealth: Payer: Self-pay

## 2014-02-06 MED ORDER — OXYCODONE-ACETAMINOPHEN 5-325 MG PO TABS: 1.0000 | ORAL_TABLET | ORAL | Status: DC | PRN

## 2014-02-06 NOTE — Telephone Encounter (Signed)
Gay Filler notified prescription for Percocet is ready to be picked up at the front desk.

## 2014-02-06 NOTE — Telephone Encounter (Signed)
Pt was seen 01/09/14 with back pain and lt hip pain; pt has finished taking muscle relaxant and pain med; after laying all night pt can hardly get up in the mornings and takes couple of hours after taking pain med to be able to function without a lot of pain. Pt request stronger pain med.pt scheduled f/u appt on 02/08/14 at 3:30 but in mean time request pain med.Please advise.[facility].

## 2014-02-06 NOTE — Telephone Encounter (Signed)
i am going to give her percocet in the mean time.

## 2014-02-08 ENCOUNTER — Ambulatory Visit (INDEPENDENT_AMBULATORY_CARE_PROVIDER_SITE_OTHER): Payer: 59 | Admitting: Family Medicine

## 2014-02-08 ENCOUNTER — Encounter: Payer: Self-pay | Admitting: Family Medicine

## 2014-02-08 ENCOUNTER — Encounter (INDEPENDENT_AMBULATORY_CARE_PROVIDER_SITE_OTHER): Payer: Self-pay

## 2014-02-08 VITALS — BP 140/70 | HR 73 | Temp 98.3°F | Ht 66.5 in | Wt 186.2 lb

## 2014-02-08 DIAGNOSIS — M5416 Radiculopathy, lumbar region: Secondary | ICD-10-CM

## 2014-02-08 DIAGNOSIS — IMO0002 Reserved for concepts with insufficient information to code with codable children: Secondary | ICD-10-CM

## 2014-02-08 MED ORDER — AMITRIPTYLINE HCL 25 MG PO TABS: 25.0000 mg | ORAL_TABLET | Freq: Every day | ORAL | Status: DC

## 2014-02-08 MED ORDER — OXYCODONE-ACETAMINOPHEN 5-325 MG PO TABS: 1.0000 | ORAL_TABLET | ORAL | Status: DC | PRN

## 2014-02-08 NOTE — Progress Notes (Signed)
02/08/2014    ID: SANDEEP DELAGARZA   MRN: [id number]  DOB: 02/18/1961  Primary Physician:  Eliezer Lofts, MD  Chief Complaint: Follow-up  Subjective:   History of Present Illness:  Carla Weaver is a 53 y.o. very pleasant female patient who presents with the following: Back Pain  F/u L lumbar radiculopathy:  Still all the way down leg. Pain meds only lasting 4 hours.  Picking up eggs for a living.  To recap, the patient has had symptoms now for over 2 months, she is having some left-sided pain in the posterior aspect in her buttocks and her hip, and posteriorlyall the way down her leg. Previously, she did have a markedly abnormal exam an abnormal straight leg raise, which is resolved.  She is still having some considerable pain. She is not having any numbness, no weakness, no footdrop or any other focal weaknesses. She has been able to work. She is taking some pain medication at nighttime only. Right now she is taking Percocet, and that is leaving her symptoms greater than the Vicodin which given originally. She also was on 14 day prednisone taper.  No groin pain.  01/09/2014 Last OV with Owens Loffler, MD  ongoing for approximately: 6 weeks The patient has had back pain before. The back pain is localized into the lumbar spine area. They also describe L radiculopathy and "hip pain" in the posterior. Unclear if groin pain.  6 weeks. Tylenol, icy hot, heating pad, Capsaicin patches.  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  No numbness or tingling. No bowel or bladder incontinence. No focal weakness. Prior interventions: none Physical therapy: No Chiropractic manipulations: No Acupuncture: No Osteopathic manipulation: No Heat or cold: Minimal effect  Past Medical History, Surgical History, Family History, Medications, Allergies have been reviewed and updated if relevant.  Review of Systems  GEN: No fevers, chills. Nontoxic. Primarily MSK c/o today. MSK:  Detailed in the HPI GI: tolerating PO intake without difficulty Neuro: As above  Otherwise the pertinent positives of the ROS are noted above.     Objective:   Physical Exam Filed Vitals:   02/08/14 1519  BP: 140/70  Pulse: 73  Temp: 98.3 F (36.8 C)  TempSrc: Oral  Height: 5' 6.5" (1.689 m)  Weight: 186 lb 4 oz (84.482 kg)    Gen: Well-developed,well-nourished,in no acute distress; alert,appropriate and cooperative throughout examination HEENT: Normocephalic and atraumatic without obvious abnormalities.  Ears, externally no deformities Pulm: Breathing comfortably in no respiratory distress Range of motion at  the waist: Flexion, rotation and lateral bending: mildly restricted in all directions. She has approx 20 percent loss of motion in all places.  No echymosis or edema Rises to examination table with no difficulty Gait: minimally antalgic  Inspection/Deformity: No abnormality Paraspinus T:  Tender to palpation from L2-S1.  B Ankle Dorsiflexion (L5,4): 5/5 B Great Toe Dorsiflexion (L5,4): 5/5 Heel Walk (L5): WNL Toe Walk (S1): WNL Rise/Squat (L4): WNL, mild pain  SENSORY B Medial Foot (L4): WNL B Dorsum (L5): WNL B Lateral (S1): WNL Light Touch: WNL Pinprick: WNL  REFLEXES Knee (L4): 2+ Ankle (S1): 2+  B SLR, seated: neg B SLR, supine: neg B FABER: neg B Reverse FABER: neg B Greater Troch: NT B Log Roll: neg B Stork: NT B Sciatic Notch: NT  Radiology: Dg Lumbar Spine Complete  01/09/2014   CLINICAL DATA:  Back pain.  EXAM: LUMBAR SPINE - COMPLETE 4+ VIEW  COMPARISON:  None.  FINDINGS:  No fracture or spondylolisthesis. Mild loss of disc height at L3-L4 with moderate loss of disc height at L4-L5. Remaining lumbar disc spaces are well preserved. Mild facet degenerative changes noted bilaterally at L4-L5 and L5-S1.  Soft tissues are unremarkable.  IMPRESSION: No fracture or acute finding.  Mild degenerative changes.   Electronically Signed   By: Lajean Manes M.D.   On: 01/09/2014 15:18   Dg Hip Complete Left  01/09/2014   CLINICAL DATA:  Hip versus back pain.  Poor range of motion.  EXAM: LEFT HIP - COMPLETE 2+ VIEW  COMPARISON:  None.  FINDINGS: There is no evidence of hip fracture or dislocation. There is no evidence of arthropathy or other focal bone abnormality.  IMPRESSION: Negative.   Electronically Signed   By: Shon Hale M.D.   On: 01/09/2014 15:25    Assessment & Plan:   Lumbar radiculopathy, acute  Probable acute disc herniation. Considerable pain. SLR is now negative.  S/p steroids Change to TCA at night Percocet for pain prn.  Discussed other options like MRI, ESI, other interventions, and she would like to be conservative for now.  Follow-up: Return in about 6 weeks (around 03/22/2014).  New Prescriptions   AMITRIPTYLINE (ELAVIL) 25 MG TABLET    Take 1 tablet (25 mg total) by mouth at bedtime.   No orders of the defined types were placed in this encounter.    Signed,  Maud Deed. Javier Mamone, MD, [facility] [place], [address] Phone: [phone] Fax: [phone]   Patient's Medications  New Prescriptions   AMITRIPTYLINE (ELAVIL) 25 MG TABLET    Take 1 tablet (25 mg total) by mouth at bedtime.  Previous Medications   ACETAMINOPHEN (TYLENOL) 500 MG TABLET    Take 500 mg by mouth 2 (two) times daily as needed.   ALPRAZOLAM (XANAX) 0.25 MG TABLET    TAKE ONE TABLET BY MOUTH AT BEDTIME AS NEEDED FOR ANXIETY( AND CAN USE OCCASSIONALLY DURING THE DAY FOR PANICK ATTACKS)   ATENOLOL (TENORMIN) 25 MG TABLET    Take 1 tablet (25 mg total) by mouth daily.  Modified Medications   Modified Medication Previous Medication   OXYCODONE-ACETAMINOPHEN (PERCOCET/ROXICET) 5-325 MG PER TABLET oxyCODONE-acetaminophen (PERCOCET/ROXICET) 5-325 MG per tablet      Take 1 tablet by mouth every 4 (four) hours as needed.    Take 1 tablet by mouth every 4  (four) hours as needed.  Discontinued Medications   CYCLOBENZAPRINE (FLEXERIL) 10 MG TABLET    Take 0.5-1 tablets (5-10 mg total) by mouth at bedtime.   HYDROCODONE-ACETAMINOPHEN (NORCO/VICODIN) 5-325 MG PER TABLET    Take 1 tablet by mouth every 6 (six) hours as needed for moderate pain.   PREDNISONE (DELTASONE) 20 MG TABLET    2 tab po for 7 days, then 1 tab po for 7 days

## 2014-02-08 NOTE — Progress Notes (Signed)
Pre visit review using our clinic review tool, if applicable. No additional management support is needed unless otherwise documented below in the visit note. 

## 2014-03-24 ENCOUNTER — Ambulatory Visit: Payer: 59 | Admitting: Family Medicine

## 2014-03-29 ENCOUNTER — Ambulatory Visit: Payer: 59 | Admitting: Family Medicine

## 2014-10-06 ENCOUNTER — Other Ambulatory Visit: Payer: Self-pay | Admitting: Family Medicine

## 2014-10-06 NOTE — Telephone Encounter (Signed)
Last office visit 02/08/2014 with Dr. Lorelei Pont for back pain.  Last CPE 09/01/2013. Ok to refill?

## 2014-10-06 NOTE — Telephone Encounter (Signed)
Needs CPX, refill until appt made.

## 2014-10-06 NOTE — Telephone Encounter (Signed)
Pt left v/m requesting status of atenolol refill.

## 2014-10-07 NOTE — Telephone Encounter (Signed)
Please schedule CPE with fasting labs prior with Dr. Bedsole.  

## 2014-11-22 ENCOUNTER — Encounter: Payer: Self-pay | Admitting: Urgent Care

## 2014-11-22 ENCOUNTER — Emergency Department
Admission: EM | Admit: 2014-11-22 | Discharge: 2014-11-22 | Disposition: A | Discharge status: Home / Self Care | Payer: 59 | Attending: Emergency Medicine | Admitting: Emergency Medicine

## 2014-11-22 DIAGNOSIS — M5432 Sciatica, left side: Secondary | ICD-10-CM

## 2014-11-22 DIAGNOSIS — Z79899 Other long term (current) drug therapy: Secondary | ICD-10-CM | POA: Diagnosis not present

## 2014-11-22 DIAGNOSIS — M545 Low back pain: Secondary | ICD-10-CM | POA: Diagnosis present

## 2014-11-22 DIAGNOSIS — Z720 Tobacco use: Secondary | ICD-10-CM | POA: Diagnosis not present

## 2014-11-22 DIAGNOSIS — M5442 Lumbago with sciatica, left side: Secondary | ICD-10-CM | POA: Diagnosis not present

## 2014-11-22 DIAGNOSIS — R51 Headache: Secondary | ICD-10-CM | POA: Diagnosis not present

## 2014-11-22 DIAGNOSIS — I10 Essential (primary) hypertension: Secondary | ICD-10-CM | POA: Insufficient documentation

## 2014-11-22 LAB — URINALYSIS COMPLETE WITH MICROSCOPIC (ARMC ONLY)
BILIRUBIN URINE: NEGATIVE
Bacteria, UA: NONE SEEN
GLUCOSE, UA: NEGATIVE mg/dL
HGB URINE DIPSTICK: NEGATIVE
KETONES UR: NEGATIVE mg/dL
Leukocytes, UA: NEGATIVE
NITRITE: NEGATIVE
Protein, ur: NEGATIVE mg/dL
Specific Gravity, Urine: 1.012 (ref 1.005–1.030)
pH: 5 (ref 5.0–8.0)

## 2014-11-22 MED ORDER — DIAZEPAM 5 MG PO TABS: 5.0000 mg | ORAL_TABLET | Freq: Two times a day (BID) | ORAL | Status: DC | PRN

## 2014-11-22 MED ORDER — OXYCODONE-ACETAMINOPHEN 5-325 MG PO TABS: 1.0000 | ORAL_TABLET | Freq: Four times a day (QID) | ORAL | Status: DC | PRN

## 2014-11-22 MED ORDER — NAPROXEN 250 MG PO TABS: 250.0000 mg | ORAL_TABLET | Freq: Two times a day (BID) | ORAL | Status: DC

## 2014-11-22 NOTE — ED Provider Notes (Signed)
[facility] Emergency Department Provider Note  ____________________________________________  Time seen: 7:15 AM  I have reviewed the triage vital signs and the nursing notes.   HISTORY  Chief Complaint Back Pain and Facial Pain    HPI Carla Weaver is a 54 y.o. female who complains of left lower back pain radiating down the left leg for 3 days. She first noticed it upon awakening Monday morning 2 days ago. She denies any prior injuries such as chronic back pain or significant previous back injuries. No known herniated disks. Over last 3-4 days, she cannot recall any specific incidents that might have injured or strained her back. No chip slips or falls. No motor vehicle collisions. No lifting of heavy things. No sudden onset of pain or popping or tearing. She denies any lower external weakness numbness or paresthesia. No bowel or bladder incontinence or retention. No fevers or chills. No previous spinal surgeries. Never Had pain like this before.   The pain is in the left lower back, radiating down the left leg. It is sharp and burning. It is worse with movement and change of position and better with lying still. No associated symptoms. Severe in intensity.       Past Medical History  Diagnosis Date  . Hypertension   . Anemia   . GERD (gastroesophageal reflux disease)   . 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 Vaginal irritation 09/01/2013  .  HX of Severe dysplasia of cervix (CIN III) 10/13/2012  . HTN (hypertension), benign 07/06/2012  . Chronic insomnia 07/06/2012  . Panic attacks 07/06/2012    Past Surgical History  Procedure Laterality Date  . Cesarean section    . Appendectomy      Current Outpatient Rx  Name  Route  Sig  Dispense  Refill  . atenolol (TENORMIN) 25 MG tablet   Oral   Take 25 mg by mouth at bedtime.         . diazepam (VALIUM) 5 MG  tablet   Oral   Take 1 tablet (5 mg total) by mouth every 12 (twelve) hours as needed for muscle spasms.   6 tablet   0   . naproxen (NAPROSYN) 250 MG tablet   Oral   Take 1 tablet (250 mg total) by mouth 2 (two) times daily with a meal.   40 tablet   0   . oxyCODONE-acetaminophen (ROXICET) 5-325 MG per tablet   Oral   Take 1 tablet by mouth every 6 (six) hours as needed for severe pain.   8 tablet   0     Allergies Sulfa antibiotics and Ampicillin  Family History  Problem Relation Age of Onset  . Hypertension Mother   . Arthritis Mother   . Hypertension Father   . Vision loss Father     galucoma  . Hypertension Sister   . Alcohol abuse Brother   . Cancer Maternal Aunt 70    breast cancer  . Colon cancer Neg Hx   . Pancreatic cancer Neg Hx   . Stomach cancer Neg Hx     Social History History  Substance Use Topics  . Smoking status: Current Every Day Smoker -- 0.03 packs/day for 34 years    Types: Cigarettes  . Smokeless tobacco: Never Used  . Alcohol Use: No    Review of Systems  Constitutional: No fever or chills.  No weight changes Eyes:No blurry vision or double vision.  ENT: No sore throat. Cardiovascular: No chest pain. Respiratory: No dyspnea or cough. Gastrointestinal: Negative for abdominal pain, vomiting and diarrhea.  No BRBPR or melena. Genitourinary: Negative for dysuria, urinary retention, bloody urine, or difficulty urinating. MusculoskeletalPositive acute back pain as above skin: Negative for rash. Neurological: Negative for headaches, focal weakness or numbness. Psychiatric:No anxiety or depression.   Endocrine:No hot/cold intolerance, changes in energy, or sleep difficulty.  10-point ROS otherwise negative.  ____________________________________________   PHYSICAL EXAM:  VITAL SIGNS: ED Triage Vitals  Enc Vitals Group     BP 11/22/14 0618 176/91 mmHg     Pulse Rate 11/22/14 0618 52     Resp 11/22/14 0618 16     Temp 11/22/14  0618 97.3 F (36.3 C)     Temp Source 11/22/14 0618 Oral     SpO2 11/22/14 0618 99 %     Weight 11/22/14 0618 180 lb (81.647 kg)     Height 11/22/14 0618 5\' 6"  (1.676 m)     Head Cir --      Peak Flow --      Pain Score 11/22/14 0618 8     Pain Loc --      Pain Edu? --      Excl. in [place]? --      Constitutional: Alert and oriented. Well appearing and in no distress. Eyes: No scleral icterus. No conjunctival pallor. PERRL. EOMI ENT   Head: Normocephalic and atraumatic.   Nose: No congestion/rhinnorhea. No septal hematoma   Mouth/Throat: MMM, no pharyngeal erythema. No peritonsillar mass. No uvula shift.   Neck: No stridor. No SubQ emphysema. No meningismus. Hematological/Lymphatic/Immunilogical: No cervical lymphadenopathy. Cardiovascular: RRR. Normal and symmetric distal pulses are present in all extremities. No murmurs, rubs, or gallops. Respiratory: Normal respiratory effort without tachypnea nor retractions. Breath sounds are clear and equal bilaterally. No wheezes/rales/rhonchi. Gastrointestinal: Soft and nontender. No distention. There is no CVA tenderness.  No rebound, rigidity, or guarding. Genitourinary: deferred Musculoskeletal: Nontender with normal range of motion in all extremities. No joint effusions.  No lower extremity tenderness.  No edema. Straight leg raise negative on the right leg, straight leg raise positive at 10-15 on the left leg which reproduces her symptoms of left lower back pain radiating down the left leg.  There is no midline spinal tenderness in the CT or L-spine. In the lower back, there is significant tenseness and tenderness of the paraspinous musculature on the left.  Neurologic:   Normal speech and language.  CN 2-10 normal. Motor grossly intact. No pronator drift.  Normal gait. No gross focal neurologic deficits are appreciated.  EHL positive  Skin:  Skin is warm, dry and intact. No rash noted.  No petechiae, purpura, or  bullae. Psychiatric: Mood and affect are normal. Speech and behavior are normal. Patient exhibits appropriate insight and judgment.  ____________________________________________    LABS (pertinent positives/negatives) (all labs ordered are listed, but only abnormal results are displayed) Labs Reviewed  URINALYSIS COMPLETEWITH MICROSCOPIC ([place] ONLY) - Abnormal; Notable for the following:    Color, Urine YELLOW (*)    APPearance CLEAR (*)    Squamous Epithelial / LPF 0-5 (*)    All other components within normal limits   ____________________________________________   EKG    ____________________________________________    RADIOLOGY    ____________________________________________   PROCEDURES  ____________________________________________   INITIAL IMPRESSION / ASSESSMENT AND PLAN / ED COURSE  Pertinent labs & imaging results  that were available during my care of the patient were reviewed by me and considered in my medical decision making (see chart for details).  Patient examination confirms sciatica which is described of the patient's symptoms. No evidence of spinal cord impairment, cauda equina, epidural abscess or hematoma. No evidence of infection. There are no neurologic deficits. We'll give the patient naproxen twice a day for pain control as well as Valium and Percocet for breakthrough. She is instructed to follow up with primary care in about a week. I gave her instructions on back stretching and exercises as well as counseled her on the dangers of narcotic medications.   ____________________________________________   FINAL CLINICAL IMPRESSION(S) / ED DIAGNOSES  Final diagnoses:  Sciatica associated with disorder of lumbar spine, left      Carrie Mew, MD 11/22/14 [phone]

## 2014-11-22 NOTE — Discharge Instructions (Signed)
Back Exercises Back exercises help treat and prevent back injuries. The goal of back exercises is to increase the strength of your abdominal and back muscles and the flexibility of your back. These exercises should be started when you no longer have back pain. Back exercises include:  Pelvic Tilt. Lie on your back with your knees bent. Tilt your pelvis until the lower part of your back is against the floor. Hold this position 5 to 10 sec and repeat 5 to 10 times.  Knee to Chest. Pull first 1 knee up against your chest and hold for 20 to 30 seconds, repeat this with the other knee, and then both knees. This may be done with the other leg straight or bent, whichever feels better.  Sit-Ups or Curl-Ups. Bend your knees 90 degrees. Start with tilting your pelvis, and do a partial, slow sit-up, lifting your trunk only 30 to 45 degrees off the floor. Take at least 2 to 3 seconds for each sit-up. Do not do sit-ups with your knees out straight. If partial sit-ups are difficult, simply do the above but with only tightening your abdominal muscles and holding it as directed.  Hip-Lift. Lie on your back with your knees flexed 90 degrees. Push down with your feet and shoulders as you raise your hips a couple inches off the floor; hold for 10 seconds, repeat 5 to 10 times.  Back arches. Lie on your stomach, propping yourself up on bent elbows. Slowly press on your hands, causing an arch in your low back. Repeat 3 to 5 times. Any initial stiffness and discomfort should lessen with repetition over time.  Shoulder-Lifts. Lie face down with arms beside your body. Keep hips and torso pressed to floor as you slowly lift your head and shoulders off the floor. Do not overdo your exercises, especially in the beginning. Exercises may cause you some mild back discomfort which lasts for a few minutes; however, if the pain is more severe, or lasts for more than 15 minutes, do not continue exercises until you see your caregiver.  Improvement with exercise therapy for back problems is slow.  See your caregivers for assistance with developing a proper back exercise program. Document Released: 06/19/2004 Document Revised: 08/04/2011 Document Reviewed: 03/13/2011 [facility] Patient Information 2015 [place], [place]. This information is not intended to replace advice given to you by your health care provider. Make sure you discuss any questions you have with your health care provider.  Sciatica Sciatica is pain, weakness, numbness, or tingling along the path of the sciatic nerve. The nerve starts in the lower back and runs down the back of each leg. The nerve controls the muscles in the lower leg and in the back of the knee, while also providing sensation to the back of the thigh, lower leg, and the sole of your foot. Sciatica is a symptom of another medical condition. For instance, nerve damage or certain conditions, such as a herniated disk or bone spur on the spine, pinch or put pressure on the sciatic nerve. This causes the pain, weakness, or other sensations normally associated with sciatica. Generally, sciatica only affects one side of the body. CAUSES   Herniated or slipped disc.  Degenerative disk disease.  A pain disorder involving the narrow muscle in the buttocks (piriformis syndrome).  Pelvic injury or fracture.  Pregnancy.  Tumor (rare). SYMPTOMS  Symptoms can vary from mild to very severe. The symptoms usually travel from the low back to the buttocks and down the back of  the leg. Symptoms can include:  Mild tingling or dull aches in the lower back, leg, or hip.  Numbness in the back of the calf or sole of the foot.  Burning sensations in the lower back, leg, or hip.  Sharp pains in the lower back, leg, or hip.  Leg weakness.  Severe back pain inhibiting movement. These symptoms may get worse with coughing, sneezing, laughing, or prolonged sitting or standing. Also, being overweight may worsen  symptoms. DIAGNOSIS  Your caregiver will perform a physical exam to look for common symptoms of sciatica. He or she may ask you to do certain movements or activities that would trigger sciatic nerve pain. Other tests may be performed to find the cause of the sciatica. These may include:  Blood tests.  X-rays.  Imaging tests, such as an MRI or CT scan. TREATMENT  Treatment is directed at the cause of the sciatic pain. Sometimes, treatment is not necessary and the pain and discomfort goes away on its own. If treatment is needed, your caregiver may suggest:  Over-the-counter medicines to relieve pain.  Prescription medicines, such as anti-inflammatory medicine, muscle relaxants, or narcotics.  Applying heat or ice to the painful area.  Steroid injections to lessen pain, irritation, and inflammation around the nerve.  Reducing activity during periods of pain.  Exercising and stretching to strengthen your abdomen and improve flexibility of your spine. Your caregiver may suggest losing weight if the extra weight makes the back pain worse.  Physical therapy.  Surgery to eliminate what is pressing or pinching the nerve, such as a bone spur or part of a herniated disk. HOME CARE INSTRUCTIONS   Only take over-the-counter or prescription medicines for pain or discomfort as directed by your caregiver.  Apply ice to the affected area for 20 minutes, 3-4 times a day for the first 48-72 hours. Then try heat in the same way.  Exercise, stretch, or perform your usual activities if these do not aggravate your pain.  Attend physical therapy sessions as directed by your caregiver.  Keep all follow-up appointments as directed by your caregiver.  Do not wear high heels or shoes that do not provide proper support.  Check your mattress to see if it is too soft. A firm mattress may lessen your pain and discomfort. SEEK IMMEDIATE MEDICAL CARE IF:   You lose control of your bowel or bladder  (incontinence).  You have increasing weakness in the lower back, pelvis, buttocks, or legs.  You have redness or swelling of your back.  You have a burning sensation when you urinate.  You have pain that gets worse when you lie down or awakens you at night.  Your pain is worse than you have experienced in the past.  Your pain is lasting longer than 4 weeks.  You are suddenly losing weight without reason. MAKE SURE YOU:  Understand these instructions.  Will watch your condition.  Will get help right away if you are not doing well or get worse. Document Released: 05/06/2001 Document Revised: 11/11/2011 Document Reviewed: 09/21/2011 [facility] Patient Information 2015 [place], [place]. This information is not intended to replace advice given to you by your health care provider. Make sure you discuss any questions you have with your health care provider.    You were prescribed a medication that is potentially sedating. Do not drink alcohol, drive or participate in any other potentially dangerous activities while taking this medication as it may make you sleepy. Do not take this medication with any  other sedating medications, either prescription or over-the-counter. If you were prescribed Percocet or Vicodin, do not take these with acetaminophen (Tylenol) as it is already contained within these medications.   Opioid pain medications (or "narcotics") can be habit forming.  Use it as little as possible to achieve adequate pain control.  Do not use or use it with extreme caution if you have a history of opiate abuse or dependence.  If you are on a pain contract with your primary care doctor or a pain specialist, be sure to let them know you were prescribed this medication today from the [facility] Emergency Department.  This medication is intended for your use only - do not give any to anyone else and keep it in a secure place where nobody else, especially children and pets, have access to  it.  It will also cause or worsen constipation, so you may want to consider taking an over-the-counter stool softener while you are taking this medication.

## 2014-11-22 NOTE — ED Notes (Signed)
Pt sitting up on side of stretcher in exam room with no distress noted; pt reports since Monday having lower back pain radiating down left leg; denies hx of same, denies any known injury; denies any accomp symptoms; taking arthritis tylenol without relief; pain currently 8/10

## 2014-11-22 NOTE — ED Notes (Signed)
Patient presents with c/o LEFT side facial pain and LEFT lower back pain since Monday. Patient states, "I have been taking Tylenol, but it aint working."

## 2014-11-22 NOTE — ED Notes (Signed)
Pt signed esignature d/c document. Computer locked up. On re-start document was not saved. Pt had already left facility. Charge nurse [employer] notified.

## 2014-12-05 ENCOUNTER — Ambulatory Visit (INDEPENDENT_AMBULATORY_CARE_PROVIDER_SITE_OTHER)
Admission: RE | Admit: 2014-12-05 | Discharge: 2014-12-05 | Disposition: A | Discharge status: Home / Self Care | Payer: 59 | Source: Ambulatory Visit | Attending: Family Medicine | Admitting: Family Medicine

## 2014-12-05 ENCOUNTER — Ambulatory Visit (INDEPENDENT_AMBULATORY_CARE_PROVIDER_SITE_OTHER): Payer: 59 | Admitting: Family Medicine

## 2014-12-05 ENCOUNTER — Encounter: Payer: Self-pay | Admitting: Family Medicine

## 2014-12-05 VITALS — BP 158/78 | HR 54 | Temp 97.6°F | Ht 66.5 in | Wt 178.5 lb

## 2014-12-05 DIAGNOSIS — M5410 Radiculopathy, site unspecified: Secondary | ICD-10-CM

## 2014-12-05 MED ORDER — CYCLOBENZAPRINE HCL 10 MG PO TABS: 10.0000 mg | ORAL_TABLET | Freq: Every evening | ORAL | Status: DC | PRN

## 2014-12-05 MED ORDER — OXYCODONE-ACETAMINOPHEN 5-325 MG PO TABS: 1.0000 | ORAL_TABLET | Freq: Three times a day (TID) | ORAL | Status: DC | PRN

## 2014-12-05 MED ORDER — DICLOFENAC SODIUM 75 MG PO TBEC: 75.0000 mg | DELAYED_RELEASE_TABLET | Freq: Two times a day (BID) | ORAL | Status: DC
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
## 2014-12-05 NOTE — Patient Instructions (Signed)
We will call you with X-ray results.  Stop naprosyn.  Start instead diclofenac twice daily for inflammation.  Can use percocet for break through pain.  Use cyclobenzaprine at bedtime for muscle relaxant if needed. Continue home PT as much as possible.  Follow up  in 2  weeks if no better.

## 2014-12-05 NOTE — Progress Notes (Signed)
Pre visit review using our clinic review tool, if applicable. No additional management support is needed unless otherwise documented below in the visit note. 
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
## 2014-12-18 ENCOUNTER — Other Ambulatory Visit: Payer: Self-pay | Admitting: Family Medicine

## 2014-12-19 ENCOUNTER — Ambulatory Visit: Payer: 59 | Admitting: Family Medicine

## 2015-01-02 ENCOUNTER — Ambulatory Visit: Payer: 59 | Admitting: Family Medicine

## 2015-03-05 ENCOUNTER — Encounter: Payer: Self-pay | Admitting: Family Medicine

## 2015-03-05 ENCOUNTER — Ambulatory Visit (INDEPENDENT_AMBULATORY_CARE_PROVIDER_SITE_OTHER): Payer: 59 | Admitting: Family Medicine

## 2015-03-05 VITALS — BP 150/78 | HR 61 | Temp 97.5°F | Ht 66.5 in | Wt 182.5 lb

## 2015-03-05 DIAGNOSIS — N949 Unspecified condition associated with female genital organs and menstrual cycle: Secondary | ICD-10-CM | POA: Diagnosis not present

## 2015-03-05 DIAGNOSIS — R300 Dysuria: Secondary | ICD-10-CM | POA: Diagnosis not present

## 2015-03-05 DIAGNOSIS — N898 Other specified noninflammatory disorders of vagina: Secondary | ICD-10-CM

## 2015-03-05 LAB — POCT URINALYSIS DIP (MANUAL ENTRY)
BILIRUBIN UA: NEGATIVE
Bilirubin, UA: NEGATIVE
Blood, UA: NEGATIVE
Glucose, UA: NEGATIVE
Leukocytes, UA: NEGATIVE
Nitrite, UA: NEGATIVE
Protein Ur, POC: NEGATIVE
Spec Grav, UA: 1.015
UROBILINOGEN UA: 0.2
pH, UA: 6

## 2015-03-05 LAB — POCT WET + KOH PREP
TRICH BY WET PREP: ABSENT
YEAST BY KOH: ABSENT
Yeast by wet prep: ABSENT

## 2015-03-05 MED ORDER — NITROFURANTOIN MONOHYD MACRO 100 MG PO CAPS: 100.0000 mg | ORAL_CAPSULE | Freq: Two times a day (BID) | ORAL | Status: DC

## 2015-03-05 NOTE — Progress Notes (Signed)
Dr. Frederico Hamman T. Chellie Vanlue, MD, [facility] [place], [address] Phone: [phone] Fax: [phone]  03/05/2015  Patient: Carla Weaver, MRN: [id number], DOB: 1960-11-28, 54 y.o.  Primary Physician:  Eliezer Lofts, MD  Chief Complaint: Dysuria and Bump on Labia  Subjective:   Carla Weaver is a 54 y.o. very pleasant female patient who presents with the following:  Bump on labia, dysuria. She comes in with 2 primary problems.  She is having dysuria and urgency.  She has been taking AZO for the last several days.  She also has a lesion on the right side of her genitals which is painful to touch.  It is somewhat elevated.  Has been taking cranberry pills and spot on her cheek of her right bottom.  AZO.   Breaking out in a sweat, then will go away.   No recent intercourse. At least 2 years. No known h/o HSV.     Past Medical History, Surgical History, Social History, Family History, Problem List, Medications, and Allergies have been reviewed and updated if relevant.  Patient Active Problem List   Diagnosis Date Noted  . Acute low back pain with radicular symptoms, duration less than 6 weeks 12/05/2014  . Vaginal irritation 09/01/2013  .  HX of Severe dysplasia of cervix (CIN III) 10/13/2012  . HTN (hypertension), benign 07/06/2012  . Chronic insomnia 07/06/2012  . Panic attacks 07/06/2012    Past Medical History  Diagnosis Date  . Hypertension   . Anemia   . GERD (gastroesophageal reflux disease)   . Severe dysplasia of cervix (CIN III) 10/13/2012    Needs LEEP    Past Surgical History  Procedure Laterality Date  . Cesarean section    . Appendectomy      Social History   Social History  . Marital Status: Married    Spouse Name: N/A  . Number of Children: N/A  . Years of Education: N/A   Occupational History  . Not on file.   Social History Main Topics  . Smoking status: Former Smoker -- 0.03  packs/day for 34 years    Types: Cigarettes  . Smokeless tobacco: Never Used     Comment: stop 8 months ago  . Alcohol Use: No  . Drug Use: No  . Sexual Activity: Not on file   Other Topics Concern  . 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   Exercise: At work.     Diet: decreased portion.    Increased water.          Family History  Problem Relation Age of Onset  . Hypertension Mother   . Arthritis Mother   . Hypertension Father   . Vision loss Father     galucoma  . Hypertension Sister   . Alcohol abuse Brother   . Cancer Maternal Aunt 70    breast cancer  . Colon cancer Neg Hx   . Pancreatic cancer Neg Hx   . Stomach cancer Neg Hx     Allergies  Allergen Reactions  . Sulfa Antibiotics   . Ampicillin Rash  . Prednisone Anxiety    Medication list reviewed and updated in full in [facility].  ROS: GEN: Acute illness details above GI: Tolerating PO intake GU: maintaining adequate hydration and urination Pulm: No SOB Interactive and getting along well at home.  Otherwise,  ROS is as per the HPI.   Objective:   BP 150/78 mmHg  Pulse 61  Temp(Src) 97.5 F (36.4 C) (Oral)  Ht 5' 6.5" (1.689 m)  Wt 182 lb 8 oz (82.781 kg)  BMI 29.02 kg/m2  GEN: WDWN, NAD, Non-toxic, A & O x 3 HEENT: Atraumatic, Normocephalic. Neck supple. No masses, No LAD. Ears and Nose: No external deformity. CV: RRR, No M/G/R. No JVD. No thrill. No extra heart sounds. PULM: CTA B, no wheezes, crackles, rhonchi. No retractions. No resp. distress. No accessory muscle use. ABD: S, NT, ND, + BS, No rebound, No HSM  EXTR: No c/c/e NEURO Normal gait.  PSYCH: Normally interactive. Conversant. Not depressed or anxious appearing.  Calm demeanor.   GU: normally exterior, R side of upper inner buttock, distal to labia majora. Elevated with small degree of ulceration and TTP. This portion of the physical examination was chaperoned by  Hedy Camara, CMA.   Laboratory and Imaging Data: Results for orders placed or performed in visit on 03/05/15  POCT urinalysis dipstick  Result Value Ref Range   Color, UA yellow yellow   Clarity, UA clear clear   Glucose, UA negative negative   Bilirubin, UA negative negative   Ketones, POC UA negative negative   Spec Grav, UA 1.015    Blood, UA negative negative   pH, UA 6.0    Protein Ur, POC negative negative   Urobilinogen, UA 0.2    Nitrite, UA Negative Negative   Leukocytes, UA Negative Negative  POCT Wet + KOH Prep ([facility])  Result Value Ref Range   Yeast by KOH Absent Present, Absent   Yeast by wet prep Absent Present, Absent   WBC by wet prep Few None, Few   Clue Cells Wet Prep HPF POC None None   Trich by wet prep Absent Present, Absent   Bacteria Wet Prep HPF POC Moderate (A) None, Few   Epithelial Cells By [employer] Pref ([facility]) Moderate (A) None, Few   RBC,UR,HPF,POC None None RBC/hpf     Assessment and Plan:   Dysuria - Plan: POCT urinalysis dipstick, CANCELED: Urine culture  Genital lesion, female - Plan: Herpes simplex virus culture  Vaginal discharge - Plan: POCT Wet + KOH Prep ([facility])  Presumptive UTI clinically. On AZO, UA limited utility. Treat and wait culture.   Check hsv culture May be boil only  Follow-up: No Follow-up on file.  New Prescriptions   NITROFURANTOIN, MACROCRYSTAL-MONOHYDRATE, (MACROBID) 100 MG CAPSULE    Take 1 capsule (100 mg total) by mouth 2 (two) times daily.   Orders Placed This Encounter  Procedures  . Herpes simplex virus culture  . POCT urinalysis dipstick  . POCT Wet + KOH Prep ([facility])    Signed,  Raneisha Bress T. Palmer Shorey, MD   Patient's Medications  New Prescriptions   NITROFURANTOIN, MACROCRYSTAL-MONOHYDRATE, (MACROBID) 100 MG CAPSULE    Take 1 capsule (100 mg total) by mouth 2 (two) times daily.  Previous Medications   ATENOLOL (TENORMIN) 25 MG TABLET    TAKE ONE TABLET BY MOUTH ONCE DAILY  Modified Medications   No  medications on file  Discontinued Medications   CYCLOBENZAPRINE (FLEXERIL) 10 MG TABLET    Take 1 tablet (10 mg total) by mouth at bedtime as needed for muscle spasms.   DICLOFENAC (VOLTAREN) 75 MG EC TABLET    Take 1 tablet (75 mg total) by mouth 2 (two) times daily.   OXYCODONE-ACETAMINOPHEN (ROXICET) 5-325 MG PER TABLET    Take 1  tablet by mouth every 8 (eight) hours as needed for severe pain.

## 2015-03-05 NOTE — Progress Notes (Signed)
Pre visit review using our clinic review tool, if applicable. No additional management support is needed unless otherwise documented below in the visit note. 

## 2015-03-06 LAB — URINE CULTURE: Colony Count: 5000

## 2015-03-07 LAB — URINE CULTURE
Colony Count: NO GROWTH
Organism ID, Bacteria: NO GROWTH

## 2015-03-07 LAB — HERPES SIMPLEX VIRUS CULTURE: ORGANISM ID, BACTERIA: NOT DETECTED

## 2015-03-22 ENCOUNTER — Telehealth: Payer: Self-pay | Admitting: Family Medicine

## 2015-03-22 NOTE — Telephone Encounter (Signed)
Lab 1/18 cpx 1/24 Pt aware Please close

## 2015-03-22 NOTE — Telephone Encounter (Signed)
Please call and schedule CPE with fasting labs prior for Dr. Diona Browner.  Needs office visit prior to any more refills.

## 2015-05-10 ENCOUNTER — Encounter: Payer: Self-pay | Admitting: Family Medicine

## 2015-05-10 ENCOUNTER — Telehealth: Payer: Self-pay | Admitting: Family Medicine

## 2015-05-10 ENCOUNTER — Ambulatory Visit (INDEPENDENT_AMBULATORY_CARE_PROVIDER_SITE_OTHER): Payer: 59 | Admitting: Family Medicine

## 2015-05-10 VITALS — BP 140/72 | HR 60 | Temp 98.7°F | Ht 66.5 in | Wt 176.8 lb

## 2015-05-10 DIAGNOSIS — I10 Essential (primary) hypertension: Secondary | ICD-10-CM

## 2015-05-10 DIAGNOSIS — Z1159 Encounter for screening for other viral diseases: Secondary | ICD-10-CM

## 2015-05-10 DIAGNOSIS — J209 Acute bronchitis, unspecified: Secondary | ICD-10-CM

## 2015-05-10 MED ORDER — GUAIFENESIN-CODEINE 100-10 MG/5ML PO SYRP: 5.0000 mL | ORAL_SOLUTION | Freq: Every evening | ORAL | Status: DC | PRN

## 2015-05-10 MED ORDER — AZITHROMYCIN 250 MG PO TABS: ORAL_TABLET | ORAL | Status: DC

## 2015-05-10 NOTE — Patient Instructions (Addendum)
Complete antibiotics.  Can use cough suppressant at night.   We will see you next week!  Call sooner if fever on antibiotics, Go to ER if severe shortness of breath.

## 2015-05-10 NOTE — Assessment & Plan Note (Signed)
>   1 month of cough in smoker. Treat with antibiotics. If not improving eval with X-ray.

## 2015-05-10 NOTE — Progress Notes (Signed)
Pre visit review using our clinic review tool, if applicable. No additional management support is needed unless otherwise documented below in the visit note. 

## 2015-05-10 NOTE — Telephone Encounter (Signed)
At time of labs..Please let pt know that I have added hep C testing to their routine labs given CDC recommends screening anyone born between 1945-1965 (higher risk population for various reasons) given it is a dormant virus (for 20-30 years) that later can cause liver cancer and liver cirrhosis. Covered by insurance.  If pt refuses, or has had in past or would to discuss further...please cancel and notify me.   

## 2015-05-10 NOTE — Progress Notes (Signed)
   Subjective:    Patient ID: Carla Weaver, female    DOB: [birth year], 54 y.o.   MRN: [id number]  Cough This is a new problem. The current episode started more than 1 month ago (1.5 months). The problem has been gradually worsening. The cough is productive of sputum. Associated symptoms include nasal congestion, shortness of breath and wheezing. Pertinent negatives include no ear congestion, ear pain, fever, headaches, myalgias or sore throat. Associated symptoms comments: Chest congestion  chest heavy with congestion at night. The symptoms are aggravated by lying down. Risk factors: former smoker, quit 10 months ago, 35 pyr history. Treatments tried: mucinex and coricidin. The treatment provided mild relief. There is no history of asthma, bronchiectasis, COPD, emphysema, environmental allergies or pneumonia.    Social History /Family History/Past Medical History reviewed and updated if needed. NO COPD, no asthma, never had spirometry in past.   Review of Systems  Constitutional: Negative for fever.  HENT: Negative for ear pain and sore throat.   Respiratory: Positive for cough, shortness of breath and wheezing.   Musculoskeletal: Negative for myalgias.  Allergic/Immunologic: Negative for environmental allergies.  Neurological: Negative for headaches.       Objective:   Physical Exam  Constitutional: Vital signs are normal. She appears well-developed and well-nourished. She is cooperative.  Non-toxic appearance. She does not appear ill. No distress.  HENT:  Head: Normocephalic.  Right Ear: Hearing, tympanic membrane, external ear and ear canal normal. Tympanic membrane is not erythematous, not retracted and not bulging.  Left Ear: Hearing, tympanic membrane, external ear and ear canal normal. Tympanic membrane is not erythematous, not retracted and not bulging.  Nose: Mucosal edema and rhinorrhea present. Right sinus exhibits no maxillary sinus tenderness and no frontal sinus  tenderness. Left sinus exhibits no maxillary sinus tenderness and no frontal sinus tenderness.  Mouth/Throat: Uvula is midline, oropharynx is clear and moist and mucous membranes are normal.  Eyes: Conjunctivae, EOM and lids are normal. Pupils are equal, round, and reactive to light. Lids are everted and swept, no foreign bodies found.  Neck: Trachea normal and normal range of motion. Neck supple. Carotid bruit is not present. No thyroid mass and no thyromegaly present.  Cardiovascular: Normal rate, regular rhythm, S1 normal, S2 normal, normal heart sounds, intact distal pulses and normal pulses.  Exam reveals no gallop and no friction rub.   No murmur heard. Pulmonary/Chest: Effort normal and breath sounds normal. No tachypnea. No respiratory distress. She has no decreased breath sounds. She has no wheezes. She has no rhonchi. She has no rales.  Neurological: She is alert.  Skin: Skin is warm, dry and intact. No rash noted.  Psychiatric: Her speech is normal and behavior is normal. Judgment normal. Her mood appears not anxious. Cognition and memory are normal. She does not exhibit a depressed mood.          Assessment & Plan:

## 2015-05-10 NOTE — Telephone Encounter (Signed)
-----   Message from Ellamae Sia sent at 05/02/2015  5:02 PM EST ----- Regarding: lab orders for Friday, 12.16.16 Patient is scheduled for CPX labs, please order future labs, Thanks , Karna Christmas

## 2015-05-11 ENCOUNTER — Other Ambulatory Visit: Payer: 59

## 2015-05-11 ENCOUNTER — Other Ambulatory Visit: Payer: Self-pay | Admitting: Family Medicine

## 2015-05-11 ENCOUNTER — Other Ambulatory Visit (INDEPENDENT_AMBULATORY_CARE_PROVIDER_SITE_OTHER): Payer: 59

## 2015-05-11 DIAGNOSIS — Z1159 Encounter for screening for other viral diseases: Secondary | ICD-10-CM

## 2015-05-11 DIAGNOSIS — I10 Essential (primary) hypertension: Secondary | ICD-10-CM | POA: Diagnosis not present

## 2015-05-11 LAB — LIPID PANEL
Cholesterol: 185 mg/dL (ref 0–200)
HDL: 38.5 mg/dL — ABNORMAL LOW (ref >39.00)
LDL CALC: 119 mg/dL — AB (ref 0–99)
NONHDL: 146.92
Total CHOL/HDL Ratio: 5
Triglycerides: 140 mg/dL (ref 0.0–149.0)
VLDL: 28 mg/dL (ref 0.0–40.0)

## 2015-05-11 LAB — COMPREHENSIVE METABOLIC PANEL
ALBUMIN: 4.1 g/dL (ref 3.5–5.2)
ALK PHOS: 55 U/L (ref 39–117)
ALT: 14 U/L (ref 0–35)
AST: 19 U/L (ref 0–37)
BUN: 10 mg/dL (ref 6–23)
CO2: 29 mEq/L (ref 19–32)
Calcium: 9.6 mg/dL (ref 8.4–10.5)
Chloride: 104 mEq/L (ref 96–112)
Creatinine, Ser: 1.06 mg/dL (ref 0.40–1.20)
GFR: 57.87 mL/min — ABNORMAL LOW (ref >60.00)
GLUCOSE: 74 mg/dL (ref 70–99)
Potassium: 3.7 mEq/L (ref 3.5–5.1)
SODIUM: 139 meq/L (ref 135–145)
TOTAL PROTEIN: 7.4 g/dL (ref 6.0–8.3)
Total Bilirubin: 0.4 mg/dL (ref 0.2–1.2)

## 2015-05-12 LAB — HEPATITIS C ANTIBODY: HCV Ab: NEGATIVE

## 2015-05-18 ENCOUNTER — Ambulatory Visit (INDEPENDENT_AMBULATORY_CARE_PROVIDER_SITE_OTHER): Payer: 59 | Admitting: Family Medicine

## 2015-05-18 ENCOUNTER — Encounter: Payer: Self-pay | Admitting: Family Medicine

## 2015-05-18 VITALS — BP 148/76 | HR 55 | Temp 98.4°F | Ht 67.5 in | Wt 176.0 lb

## 2015-05-18 DIAGNOSIS — Z0000 Encounter for general adult medical examination without abnormal findings: Secondary | ICD-10-CM

## 2015-05-18 DIAGNOSIS — Z23 Encounter for immunization: Secondary | ICD-10-CM | POA: Diagnosis not present

## 2015-05-18 DIAGNOSIS — I10 Essential (primary) hypertension: Secondary | ICD-10-CM

## 2015-05-18 MED ORDER — ATENOLOL 25 MG PO TABS: 25.0000 mg | ORAL_TABLET | Freq: Every day | ORAL | Status: DC

## 2015-05-18 NOTE — Assessment & Plan Note (Signed)
Well controlled at home on atenolol daily, may have white coat HTN.

## 2015-05-18 NOTE — Patient Instructions (Addendum)
Keep kidneys happy.. Avoid aleve, advil. Drink lots of water.  Make sure Blood pressure at home stays less than 140./90  Exchange animal fats for veggies fats.  Increase exercise if able.  Call to set up mammogram on your own.

## 2015-05-18 NOTE — Progress Notes (Signed)
The patient is here for annual wellness exam and preventative care.   Treated for bronchitis on 12/15 with Zpack She reports she is much improved cough. No SOB.  Hypertension: BP elevated today and last OV  on atenolol  She feels due to white coat HTN given good control at home. BP Readings from Last 3 Encounters:  05/18/15 148/76  05/10/15 140/72  03/05/15 150/78  Using medication without problems or lightheadedness: None  Chest pain with exertion:None  Edema:None  Short of breath:None  Average home BPs: 124/73-78 HR 64 Other issues:   Panic attacks/generalized anxiety:  A lot of stress but coping better on no ,med. No depression.  No SI.   Chronic insomnia: Improved some on no medication. She is tolerating well.  Exercise: walking 1-2 miles a day  Diet: moderate, some fried foods, trying to improve diet.  Reviewed labs in detail.  Review of Systems  Constitutional: Negative for fever, fatigue and unexpected weight change.  HENT: Negative for ear pain, congestion, sore throat, sneezing, trouble swallowing and sinus pressure.  Eyes: Negative for pain and itching.  Respiratory: Negative for cough, shortness of breath and wheezing.  Cardiovascular: Negative for chest pain, palpitations and leg swelling.  Gastrointestinal: Negative for nausea, abdominal pain, diarrhea, constipation and blood in stool.  Genitourinary: Negative for dysuria, hematuria, vaginal discharge, difficulty urinating and menstrual problem.  Skin: Negative for rash.  Neurological: Negative for syncope, weakness, light-headedness, numbness and headaches.  Psychiatric/Behavioral: Negative for confusion and dysphoric mood. The patient is not nervous/anxious. NO SI., no HI. Objective:   Physical Exam  Constitutional: Vital signs are normal. She appears well-developed and well-nourished. She is cooperative. Non-toxic appearance. She does not appear ill. No distress.  HENT:  Head:  Normocephalic.  Right Ear: Hearing, tympanic membrane, external ear and ear canal normal.  Left Ear: Hearing, tympanic membrane, external ear and ear canal normal.  Nose: Nose normal.  Eyes: Conjunctivae, EOM and lids are normal. Pupils are equal, round, and reactive to light. No foreign bodies found.  Neck: Trachea normal and normal range of motion. Neck supple. Carotid bruit is not present. No mass and no thyromegaly present.  Cardiovascular: Normal rate, regular rhythm, S1 normal, S2 normal, normal heart sounds and intact distal pulses. Exam reveals no gallop.  No murmur heard.  Pulmonary/Chest: Effort normal and breath sounds normal. No respiratory distress. She has no wheezes. She has no rhonchi. She has no rales.  Abdominal: Soft. Normal appearance and bowel sounds are normal. She exhibits no distension, no fluid wave, no abdominal bruit and no mass. There is no hepatosplenomegaly. There is no tenderness. There is no rebound, no guarding and no CVA tenderness. No hernia.  Genitourinary: Uterus normal. No breast swelling, tenderness, discharge or bleeding. Pelvic exam was performed with patient prone. There is no rash, tenderness or lesion on the right labia. There is no rash, tenderness or lesion on the left labia. Uterus is not enlarged and not tender. Cervix exhibits no motion tenderness, no discharge and no friability. Right adnexum displays no mass, no tenderness and no fullness. Left adnexum displays no mass, no tenderness and no fullness. No erythema or tenderness around the vagina. No signs of injury around the vagina. Lymphadenopathy:  She has no cervical adenopathy.  She has no axillary adenopathy.  Neurological: She is alert. She has normal strength. No cranial nerve deficit or sensory deficit.  Skin: Skin is warm, dry and intact. No rash noted.  Psychiatric: Her speech is normal and behavior  is normal. Judgment normal. Her mood appears not anxious. Cognition and memory are  normal. She does not exhibit a depressed mood.  Assessment & Plan:   The patient's preventative maintenance and recommended screening tests for an annual wellness exam were reviewed in full today.  Brought up to date unless services declined.  Counselled on the importance of diet, exercise, and its role in overall health and mortality.  The patient's FH and SH was reviewed, including their home life, tobacco status, and drug and alcohol status.   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 " They told me they got it all" No follow up recommended. . 08/2013 here: nml, no high risk HPV seen. Repeat pap in 3 years Mammo:overdue.  Former smoker, quit 8 months ago!! colon cancer screen: 10/2013 colonscopy, Carlean Purl, low risk polyps, plan repeat in 10 years.  Hep C neg  Vaccines: due for flu (refused), Tdap   HIV; refused

## 2015-05-18 NOTE — Addendum Note (Signed)
Addended by: [place] Lions on: 05/18/2015 04:29 PM   Modules accepted: Orders

## 2015-05-18 NOTE — Progress Notes (Signed)
Pre visit review using our clinic review tool, if applicable. No additional management support is needed unless otherwise documented below in the visit note. 

## 2015-05-29 ENCOUNTER — Telehealth: Payer: Self-pay | Admitting: Family Medicine

## 2015-05-29 NOTE — Telephone Encounter (Signed)
Pt's blood pressure pills were called in on 12/23.  Pt was told she couldn't get the meds until 05/29/15.  Pt went to get them today and she was told she couldn't get them because she didn't have insurance anymore and patient couldn't pay out of pocket. Pt needs help to know what to do.

## 2015-05-30 NOTE — Telephone Encounter (Signed)
Spoke with pharmacist at [facility]  Atenolol 25 mg for 90 day supply is $10 and 30 day supply $4 out of pocket.  Tried to call Carla Weaver and her line is busy.

## 2015-05-31 NOTE — Telephone Encounter (Signed)
Have tried to reach Carla Weaver numerous times and her line is busy.  I am out of the office on Friday.  Don't know what else I can do other than wait for patient to call back.  Will forward to Dr. Diona Browner to make her aware of what is going on.

## 2015-06-13 ENCOUNTER — Other Ambulatory Visit: Payer: 59

## 2015-06-19 ENCOUNTER — Encounter: Payer: 59 | Admitting: Family Medicine

## 2015-08-07 ENCOUNTER — Ambulatory Visit: Payer: Self-pay | Admitting: Family Medicine

## 2015-08-09 ENCOUNTER — Ambulatory Visit (INDEPENDENT_AMBULATORY_CARE_PROVIDER_SITE_OTHER): Payer: Self-pay | Admitting: Family Medicine

## 2015-08-09 ENCOUNTER — Encounter: Payer: Self-pay | Admitting: Family Medicine

## 2015-08-09 VITALS — BP 150/70 | HR 61 | Temp 97.4°F | Ht 67.5 in | Wt 179.0 lb

## 2015-08-09 DIAGNOSIS — M5412 Radiculopathy, cervical region: Secondary | ICD-10-CM

## 2015-08-09 MED ORDER — DICLOFENAC SODIUM 75 MG PO TBEC: 75.0000 mg | DELAYED_RELEASE_TABLET | Freq: Two times a day (BID) | ORAL | Status: AC

## 2015-08-09 MED ORDER — NORTRIPTYLINE HCL 25 MG PO CAPS: 25.0000 mg | ORAL_CAPSULE | Freq: Every day | ORAL | Status: DC

## 2015-08-09 NOTE — Progress Notes (Signed)
Dr. Frederico Hamman T. Telicia Hodgkiss, MD, [facility] [place], [address] Phone: [phone] Fax: [phone]  08/09/2015  Patient: Carla Weaver, MRN: [id number], DOB: Mar 18, 1961, 55 y.o.  Primary Physician:  Eliezer Lofts, MD   Chief Complaint  Patient presents with  . Arm Pain    Left  . Numbness    in fingers   Subjective:   Carla Weaver is a 55 y.o. very pleasant female patient who presents with the following:  Pain was shoveling and using the rake, used some alleve and tylenol.   Neck is bothering her through the L side, not   This is been ongoing for about the last 3 or 4 weeks, and the patient has had continued radicular symptoms on the left side.  She also has some numbness.  There is no change in strength.  Some pain in the shoulder blade in the shoulder itself as well as the neck.  No impingement signs or symptoms, and no pain with abduction.  Past Medical History, Surgical History, Social History, Family History, Problem List, Medications, and Allergies have been reviewed and updated if relevant.  Patient Active Problem List   Diagnosis Date Noted  .  HX of Severe dysplasia of cervix (CIN III) 10/13/2012  . HTN (hypertension), benign 07/06/2012  . Chronic insomnia 07/06/2012    Past Medical History  Diagnosis Date  . Hypertension   . Anemia   . GERD (gastroesophageal reflux disease)   . Severe dysplasia of cervix (CIN III) 10/13/2012    Needs LEEP    Past Surgical History  Procedure Laterality Date  . Cesarean section    . Appendectomy      Social History   Social History  . Marital Status: Married    Spouse Name: N/A  . Number of Children: N/A  . Years of Education: N/A   Occupational History  . Not on file.   Social History Main Topics  . Smoking status: Former Smoker -- 0.03 packs/day for 34 years    Types: Cigarettes  . Smokeless tobacco: Never Used     Comment: stop 8 months ago  .  Alcohol Use: No  . Drug Use: No  . Sexual Activity: Not on file   Other Topics Concern  . 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   Exercise: At work.     Diet: decreased portion.    Increased water.          Family History  Problem Relation Age of Onset  . Hypertension Mother   . Arthritis Mother   . Hypertension Father   . Vision loss Father     galucoma  . Hypertension Sister   . Alcohol abuse Brother   . Cancer Maternal Aunt 70    breast cancer  . Colon cancer Neg Hx   . Pancreatic cancer Neg Hx   . Stomach cancer Neg Hx     Allergies  Allergen Reactions  . Sulfa Antibiotics   . Ampicillin Rash  . Prednisone Anxiety    Medication list reviewed and updated in full in [place].  GEN: no acute illness or fever CV: No chest pain or shortness of breath MSK: detailed above Neuro: neurological signs are described above ROS O/w per HPI  Objective:   BP 150/70 mmHg  Pulse 61  Temp(Src) 97.4 F (36.3 C) (  Oral)  Ht 5' 7.5" (1.715 m)  Wt 179 lb (81.194 kg)  BMI 27.61 kg/m2   GEN: Well-developed,well-nourished,in no acute distress; alert,appropriate and cooperative throughout examination HEENT: Normocephalic and atraumatic without obvious abnormalities. Ears, externally no deformities PULM: Breathing comfortably in no respiratory distress EXT: No clubbing, cyanosis, or edema PSYCH: Normally interactive. Cooperative during the interview. Pleasant. Friendly and conversant. Not anxious or depressed appearing. Normal, full affect.  CERVICAL SPINE EXAM Range of motion: Flexion, extension, lateral bending, and rotation: modest restriction in all directions Pain with terminal motion: yes, mild Spinous Processes: NT SCM: NT Upper paracervical muscles: mild ttp Upper traps: NT C5-T1 intact, sensation and motor - with the exception decreased sensation throughout the entirety of the arm, more in the medial  nerve distribution, but there is also some slight disc decreased to an alteration of sensation on ulnar distribution as well.  Motor function is preserved.  Radiology: No results found.  Assessment and Plan:   Left cervical radiculopathy  ffinances are somewhat limited in this case, and I have given her paperwork to try to get some assistance from the hospital.  Reviewed some [facility] protocol rehabilitation exercises, and recommended oral diclofenac, as well as Pamelor for neuropathic pain.  She is intolerant of steroids per her report.  Follow-up: 4 weeks  New Prescriptions   DICLOFENAC (VOLTAREN) 75 MG EC TABLET    Take 1 tablet (75 mg total) by mouth 2 (two) times daily.   NORTRIPTYLINE (PAMELOR) 25 MG CAPSULE    Take 1 capsule (25 mg total) by mouth at bedtime.   Signed,  Maud Deed. Janelie Goltz, MD   Patient's Medications  New Prescriptions   DICLOFENAC (VOLTAREN) 75 MG EC TABLET    Take 1 tablet (75 mg total) by mouth 2 (two) times daily.   NORTRIPTYLINE (PAMELOR) 25 MG CAPSULE    Take 1 capsule (25 mg total) by mouth at bedtime.  Previous Medications   ATENOLOL (TENORMIN) 25 MG TABLET    Take 1 tablet (25 mg total) by mouth daily.  Modified Medications   No medications on file  Discontinued Medications   No medications on file

## 2015-08-09 NOTE — Progress Notes (Signed)
Pre visit review using our clinic review tool, if applicable. No additional management support is needed unless otherwise documented below in the visit note. 

## 2015-08-13 ENCOUNTER — Telehealth: Payer: Self-pay

## 2015-08-13 NOTE — Telephone Encounter (Signed)
Carla Weaver notified as instructed by telephone.  She feels she is fine to wait until Wednesday to see Dr. Lorelei Pont.  Appointment scheduled 08/15/2015 at 3:15 pm with Dr. Lorelei Pont.

## 2015-08-13 NOTE — Telephone Encounter (Signed)
PLEASE NOTE: All timestamps contained within this report are represented as [place] Standard Time. CONFIDENTIALTY NOTICE: This fax transmission is intended only for the addressee. It contains information that is legally privileged, confidential or otherwise protected from use or disclosure. If you are not the intended recipient, you are strictly prohibited from reviewing, disclosing, copying using or disseminating any of this information or taking any action in reliance on or regarding this information. If you have received this fax in error, please notify us immediately by telephone so that we can arrange for its return to [place]. Phone: [phone], Toll-Free: [phone], Fax: [phone] Page: 1 of 2 Call Id: [id number] [place] Patient Name: Carla Weaver Gender: Female DOB: Oct 23, 1960 Age: 55 Y 2 M 18 D Return Phone Number: [phone] (Primary), [phone] (Secondary) Address: [place] [address] Client [facility] - Client Client Site [facility] - Day Physician [place] Type Call Who Is Calling Patient / Member / Family / Caregiver Call Type Triage / Clinical Caller Name Anderson Malta Relationship To Patient Daughter Return Phone Number [phone] (Primary) Chief Complaint Numbness Reason for Call Symptomatic / Request for Health Information Initial Comment Caller states Mom was seen on Thurs, prescribed two anti inflammatory drugs that are not helping with the finger pain (numbness). Appointment Disposition EMR Appointment Not Necessary Info pasted into Epic No PreDisposition Call Doctor Translation No Nurse Assessment Nurse: Markus Daft, RN, Sherre Poot Date/Time Eilene Ghazi Time): 08/11/2015 11:02:22 AM Confirm and document reason for call. If symptomatic, describe symptoms. You must click the next button to save  text entered. ---Caller states she is having finger pain in 3 fingers and spreads to left arm on /off with numbness only in 3 fingers for about 3-4 wks after a lot of raking and shoveling. She was seen by MD on Thurs, and advised that she had a pinched nerve, and prescribed two anti inflammatory drugs - Diclofenac 25 mg QD and Nortriptyline 75 mg BID. She feels the finger pain/numbness is a little worse, rates 8/10, and numbness is in 3 fingers and spreading to palm of her hand. Has the patient traveled out of the country within the last 30 days? ---Not Applicable Does the patient have any new or worsening symptoms? ---Yes Will a triage be completed? ---Yes Related visit to physician within the last 2 weeks? ---Yes Does the PT have any chronic conditions? (i.e. diabetes, asthma, etc.) ---Yes List chronic conditions. ---HTN Is the patient pregnant or possibly pregnant? (Ask all females between the ages of 73-55) ---No Is this a behavioral health or substance abuse call? ---No PLEASE NOTE: All timestamps contained within this report are represented as [place] Standard Time. CONFIDENTIALTY NOTICE: This fax transmission is intended only for the addressee. It contains information that is legally privileged, confidential or otherwise protected from use or disclosure. If you are not the intended recipient, you are strictly prohibited from reviewing, disclosing, copying using or disseminating any of this information or taking any action in reliance on or regarding this information. If you have received this fax in error, please notify us immediately by telephone so that we can arrange for its return to [place]. Phone: [phone], Toll-Free: [phone], Fax: [phone] Page: 2 of 2 Call Id: [id number] Guidelines Guideline Title Affirmed Question Affirmed Notes Nurse Date/Time Eilene Ghazi Time) Finger Pain [1] SEVERE pain (e.g., excruciating) AND [2] not improved after 2 hours of pain  medicine Markus Daft,  RN, Sherre Poot 08/11/2015 11:11:13 AM Disp. Time Eilene Ghazi Time) Disposition Final User 08/11/2015 11:01:13 AM Send To Clinical Follow Up Alric Quan, RN, Fraser Din 08/11/2015 11:15:02 AM See Physician within 4 Hours (or PCP triage) Yes Markus Daft, RN, Kenton Kingfisher Understands: Yes Disagree/Comply: Comply Care Advice Given Per Guideline SEE PHYSICIAN WITHIN 4 HOURS (or PCP triage): * IF OFFICE WILL BE OPEN: You need to be seen within the next 3 or 4 hours. Call your doctor's office now or as soon as it opens. PAIN MEDICINES: as prescribed. * Before taking any medicine, read all the instructions on the package. CALL BACK IF: * You become worse. CARE ADVICE given per Finger Pain (Adult) guideline. After Care Instructions Given Call Event Type User Date / Time Description Education document email Vivianne Master, Sherre Poot 08/11/2015 11:14:33 AM [facility] Connect Now Instructions Referrals GO TO FACILITY UNDECIDED

## 2015-08-13 NOTE — Telephone Encounter (Signed)
This is an extremely difficult case in terms of management. Patient without insurance. Offered prednisone before and declined. Further work-up reasonable, and if the patient feels that she can wait until Wednesday, I can see her again.   If she feels emergent changes or this is not acceptable, then evaluation in the ER is always reasonable.

## 2015-08-15 ENCOUNTER — Ambulatory Visit: Payer: Self-pay | Admitting: Family Medicine

## 2015-09-10 ENCOUNTER — Ambulatory Visit: Payer: Self-pay | Admitting: Family Medicine

## 2015-09-12 ENCOUNTER — Encounter: Payer: Self-pay | Admitting: Family Medicine

## 2015-09-12 ENCOUNTER — Ambulatory Visit (INDEPENDENT_AMBULATORY_CARE_PROVIDER_SITE_OTHER): Payer: Self-pay | Admitting: Family Medicine

## 2015-09-12 VITALS — BP 150/72 | HR 64 | Temp 97.4°F | Ht 67.5 in | Wt 180.5 lb

## 2015-09-12 DIAGNOSIS — M5412 Radiculopathy, cervical region: Secondary | ICD-10-CM

## 2015-09-12 DIAGNOSIS — R200 Anesthesia of skin: Secondary | ICD-10-CM

## 2015-09-12 DIAGNOSIS — R208 Other disturbances of skin sensation: Secondary | ICD-10-CM

## 2015-09-12 MED ORDER — NORTRIPTYLINE HCL 50 MG PO CAPS: 50.0000 mg | ORAL_CAPSULE | Freq: Every day | ORAL | Status: AC

## 2015-09-12 MED ORDER — NORTRIPTYLINE HCL 75 MG PO CAPS
75.0000 mg | ORAL_CAPSULE | Freq: Every day | ORAL | Status: AC
Start: 2015-09-12 — End: ?

## 2015-09-12 NOTE — Progress Notes (Signed)
Dr. Frederico Hamman T. Idora Brosious, MD, [facility] [place], [address] Phone: [phone] Fax: [phone]  09/12/2015  Patient: Carla Weaver, MRN: [id number], DOB: [birth year], 55 y.o.  Primary Physician:  Eliezer Lofts, MD   Chief Complaint  Patient presents with  . Follow-up    Left Cervical Radiculopathy   Subjective:   Carla Weaver is a 55 y.o. very pleasant female patient who presents with the following:  F/u L cervical radiculopathy:   Pain is better, 3 fingers on the L are finger. 1-3. These remain numb.  She is not really having any pain at all.  No alteration in her strength.  Her range of motion is better.  She is currently on Pamelor 25 mg at night.   08/09/2015 Last OV with Owens Loffler, MD  Pain was shoveling and using the rake, used some alleve and tylenol.   Neck is bothering her through the L side, not   This is been ongoing for about the last 3 or 4 weeks, and the patient has had continued radicular symptoms on the left side.  She also has some numbness.  There is no change in strength.  Some pain in the shoulder blade in the shoulder itself as well as the neck.  No impingement signs or symptoms, and no pain with abduction.  Past Medical History, Surgical History, Social History, Family History, Problem List, Medications, and Allergies have been reviewed and updated if relevant.  Patient Active Problem List   Diagnosis Date Noted  .  HX of Severe dysplasia of cervix (CIN III) 10/13/2012  . HTN (hypertension), benign 07/06/2012  . Chronic insomnia 07/06/2012    Past Medical History  Diagnosis Date  . Hypertension   . Anemia   . GERD (gastroesophageal reflux disease)   . Severe dysplasia of cervix (CIN III) 10/13/2012    Needs LEEP    Past Surgical History  Procedure Laterality Date  . Cesarean section    . Appendectomy      Social History   Social History  . Marital Status: Married      Spouse Name: N/A  . Number of Children: N/A  . Years of Education: N/A   Occupational History  . Not on file.   Social History Main Topics  . Smoking status: Former Smoker -- 0.03 packs/day for 34 years    Types: Cigarettes  . Smokeless tobacco: Never Used     Comment: stop 8 months ago  . Alcohol Use: No  . Drug Use: No  . Sexual Activity: Not on file   Other Topics Concern  . 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   Exercise: At work.     Diet: decreased portion.    Increased water.          Family History  Problem Relation Age of Onset  . Hypertension Mother   . Arthritis Mother   . Hypertension Father   . Vision loss Father     galucoma  . Hypertension Sister   . Alcohol abuse Brother   . Cancer Maternal Aunt 70    breast cancer  . Colon cancer Neg Hx   . Pancreatic cancer Neg Hx   . Stomach cancer Neg Hx     Allergies  Allergen Reactions  . Sulfa Antibiotics   . Ampicillin Rash  . Prednisone Anxiety  Medication list reviewed and updated in full in [place].  GEN: no acute illness or fever CV: No chest pain or shortness of breath MSK: detailed above Neuro: neurological signs are described above ROS O/w per HPI  Objective:   BP 150/72 mmHg  Pulse 64  Temp(Src) 97.4 F (36.3 C) (Oral)  Ht 5' 7.5" (1.715 m)  Wt 180 lb 8 oz (81.874 kg)  BMI 27.84 kg/m2   GEN: Well-developed,well-nourished,in no acute distress; alert,appropriate and cooperative throughout examination HEENT: Normocephalic and atraumatic without obvious abnormalities. Ears, externally no deformities PULM: Breathing comfortably in no respiratory distress EXT: No clubbing, cyanosis, or edema PSYCH: Normally interactive. Cooperative during the interview. Pleasant. Friendly and conversant. Not anxious or depressed appearing. Normal, full affect.  CERVICAL SPINE EXAM Range of motion: Flexion, extension, lateral  bending, and rotation: full ROM Pain with terminal motion: no Spinous Processes: NT SCM: NT Upper paracervical muscles: nt Upper traps: NT C5-T1 intact, sensation and motor - with the exception decreased sensation throughout the entirety of the arm, more in the medial nerve distribution, but there is also some slight disc decreased to an alteration of sensation on ulnar distribution as well.  Motor function is preserved.  Radiology: No results found.  Assessment and Plan:   Left cervical radiculopathy  Numbness of left hand  She is making good progress, and she feels quite a bit better with the exception of the numbness.  We're going to increase her nortriptyline up to 50 mg for 1 month, then increase to 75 mg.  Hopefully she will continue to improve.  Follow-up: Return in about 6 weeks (around 10/24/2015).  New Prescriptions   NORTRIPTYLINE (PAMELOR) 75 MG CAPSULE    Take 1 capsule (75 mg total) by mouth at bedtime.   Modified Medications   Modified Medication Previous Medication   NORTRIPTYLINE (PAMELOR) 50 MG CAPSULE nortriptyline (PAMELOR) 25 MG capsule      Take 1 capsule (50 mg total) by mouth at bedtime.    Take 1 capsule (25 mg total) by mouth at bedtime.   No orders of the defined types were placed in this encounter.    Signed,  Maud Deed. Cutter Passey, MD   Patient's Medications  New Prescriptions   NORTRIPTYLINE (PAMELOR) 75 MG CAPSULE    Take 1 capsule (75 mg total) by mouth at bedtime.  Previous Medications   ATENOLOL (TENORMIN) 25 MG TABLET    Take 1 tablet (25 mg total) by mouth daily.   DICLOFENAC (VOLTAREN) 75 MG EC TABLET    Take 1 tablet (75 mg total) by mouth 2 (two) times daily.  Modified Medications   Modified Medication Previous Medication   NORTRIPTYLINE (PAMELOR) 50 MG CAPSULE nortriptyline (PAMELOR) 25 MG capsule      Take 1 capsule (50 mg total) by mouth at bedtime.    Take 1 capsule (25 mg total) by mouth at bedtime.  Discontinued Medications   No  medications on file

## 2015-09-12 NOTE — Progress Notes (Signed)
Pre visit review using our clinic review tool, if applicable. No additional management support is needed unless otherwise documented below in the visit note. 

## 2015-09-13 ENCOUNTER — Telehealth: Payer: Self-pay

## 2015-09-13 NOTE — Telephone Encounter (Signed)
Usually this med actually helps with sleep, but given has opposite effect, she could skip it tonight and try during the day. If still issues, I would return to the previous dose.  Will send to Dr. Loletha Grayer for his input as well.

## 2015-09-13 NOTE — Telephone Encounter (Signed)
Pt left v/m; pt was seen on 09/12/15; nortriptyline was increased and pt was not able to sleep last night; pt was very restless. Pt wants to know if should continue taking the medication. Pt request [facility]. [facility] [address].

## 2015-09-13 NOTE — Telephone Encounter (Signed)
Received message from VM saying the customer is not accepting calls, try again later.

## 2015-09-14 NOTE — Telephone Encounter (Signed)
With nortryptyline, a lot of people do not get sleepy - this is not that surprising. I would try taking it in the AM over the weekend to see if this improves. I suspect it will be transient and is likely from going from 25 to 50 mg. With a little time, should get better.

## 2015-09-14 NOTE — Telephone Encounter (Signed)
Lucresia notified as instructed by telephone.

## 2015-09-16 ENCOUNTER — Emergency Department
Admission: EM | Admit: 2015-09-16 | Discharge: 2015-09-16 | Disposition: A | Discharge status: Home / Self Care | Payer: Self-pay | Attending: Emergency Medicine | Admitting: Emergency Medicine

## 2015-09-16 ENCOUNTER — Emergency Department: Payer: Self-pay

## 2015-09-16 ENCOUNTER — Encounter: Payer: Self-pay | Admitting: Emergency Medicine

## 2015-09-16 DIAGNOSIS — I10 Essential (primary) hypertension: Secondary | ICD-10-CM | POA: Insufficient documentation

## 2015-09-16 DIAGNOSIS — F17210 Nicotine dependence, cigarettes, uncomplicated: Secondary | ICD-10-CM | POA: Insufficient documentation

## 2015-09-16 DIAGNOSIS — M79661 Pain in right lower leg: Secondary | ICD-10-CM

## 2015-09-16 DIAGNOSIS — M5431 Sciatica, right side: Secondary | ICD-10-CM | POA: Insufficient documentation

## 2015-09-16 MED ORDER — HYDROMORPHONE HCL 2 MG PO TABS: 2.0000 mg | ORAL_TABLET | Freq: Two times a day (BID) | ORAL | Status: AC | PRN

## 2015-09-16 NOTE — Discharge Instructions (Signed)
Sciatica °Sciatica is pain, weakness, numbness, or tingling along the path of the sciatic nerve. The nerve starts in the lower back and runs down the back of each leg. The nerve controls the muscles in the lower leg and in the back of the knee, while also providing sensation to the back of the thigh, lower leg, and the sole of your foot. Sciatica is a symptom of another medical condition. For instance, nerve damage or certain conditions, such as a herniated disk or bone spur on the spine, pinch or put pressure on the sciatic nerve. This causes the pain, weakness, or other sensations normally associated with sciatica. Generally, sciatica only affects one side of the body. °CAUSES  °· Herniated or slipped disc. °· Degenerative disk disease. °· A pain disorder involving the narrow muscle in the buttocks (piriformis syndrome). °· Pelvic injury or fracture. °· Pregnancy. °· Tumor (rare). °SYMPTOMS  °Symptoms can vary from mild to very severe. The symptoms usually travel from the low back to the buttocks and down the back of the leg. Symptoms can include: °· Mild tingling or dull aches in the lower back, leg, or hip. °· Numbness in the back of the calf or sole of the foot. °· Burning sensations in the lower back, leg, or hip. °· Sharp pains in the lower back, leg, or hip. °· Leg weakness. °· Severe back pain inhibiting movement. °These symptoms may get worse with coughing, sneezing, laughing, or prolonged sitting or standing. Also, being overweight may worsen symptoms. °DIAGNOSIS  °Your caregiver will perform a physical exam to look for common symptoms of sciatica. He or she may ask you to do certain movements or activities that would trigger sciatic nerve pain. Other tests may be performed to find the cause of the sciatica. These may include: °· Blood tests. °· X-rays. °· Imaging tests, such as an MRI or CT scan. °TREATMENT  °Treatment is directed at the cause of the sciatic pain. Sometimes, treatment is not necessary  and the pain and discomfort goes away on its own. If treatment is needed, your caregiver may suggest: °· Over-the-counter medicines to relieve pain. °· Prescription medicines, such as anti-inflammatory medicine, muscle relaxants, or narcotics. °· Applying heat or ice to the painful area. °· Steroid injections to lessen pain, irritation, and inflammation around the nerve. °· Reducing activity during periods of pain. °· Exercising and stretching to strengthen your abdomen and improve flexibility of your spine. Your caregiver may suggest losing weight if the extra weight makes the back pain worse. °· Physical therapy. °· Surgery to eliminate what is pressing or pinching the nerve, such as a bone spur or part of a herniated disk. °HOME CARE INSTRUCTIONS  °· Only take over-the-counter or prescription medicines for pain or discomfort as directed by your caregiver. °· Apply ice to the affected area for 20 minutes, 3-4 times a day for the first 48-72 hours. Then try heat in the same way. °· Exercise, stretch, or perform your usual activities if these do not aggravate your pain. °· Attend physical therapy sessions as directed by your caregiver. °· Keep all follow-up appointments as directed by your caregiver. °· Do not wear high heels or shoes that do not provide proper support. °· Check your mattress to see if it is too soft. A firm mattress may lessen your pain and discomfort. °SEEK IMMEDIATE MEDICAL CARE IF:  °· You lose control of your bowel or bladder (incontinence). °· You have increasing weakness in the lower back, pelvis, buttocks,   or legs.  You have redness or swelling of your back.  You have a burning sensation when you urinate.  You have pain that gets worse when you lie down or awakens you at night.  Your pain is worse than you have experienced in the past.  Your pain is lasting longer than 4 weeks.  You are suddenly losing weight without reason. MAKE SURE YOU:  Understand these  instructions.  Will watch your condition.  Will get help right away if you are not doing well or get worse.   This information is not intended to replace advice given to you by your health care provider. Make sure you discuss any questions you have with your health care provider.   Document Released: 05/06/2001 Document Revised: 01/31/2015 Document Reviewed: 09/21/2011 Elsevier Interactive Patient Education [employer].  Please return immediately if condition worsens. Please contact her primary physician or the physician you were given for referral. If you have any specialist physicians involved in her treatment and plan please also contact them. Thank you for using [facility] emergency [facility].

## 2015-09-16 NOTE — ED Notes (Signed)
Patient to ER for pain to right calf and right ankle. States ankle aches, but pain to right calf is severe. Denies any known injury. +Smoker. Denies any recent long distant travel. Patient reports difficulty walking on leg d/t pain.

## 2015-09-16 NOTE — ED Notes (Signed)
Discussed discharge instructions, prescriptions, and follow-up care with patient. No questions or concerns at this time. Pt stable at discharge.  

## 2015-09-16 NOTE — ED Provider Notes (Signed)
Time Seen: Approximately 1440  I have reviewed the triage notes  Chief Complaint: Leg Pain   History of Present Illness: Carla Weaver is a 55 y.o. female who presents with some right lower extremity pain. States her pain started 48 hours ago and mainly started toward the right calf region and radiates up toward her right hip posteriorly. She denies much in way of significant low back pain. She denies any fever or trauma. She has had some recent onset of radiculopathy especially at the base of her cervical spine. Patient's on some nortriptyline and Voltaran which she states she's taken at home for pain. She denies any difficulty with urination or bowel movements weakness   Past Medical History  Diagnosis Date  . Hypertension   . Anemia   . GERD (gastroesophageal reflux disease)   . 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  HX of Severe dysplasia of cervix (CIN III) 10/13/2012  . HTN (hypertension), benign 07/06/2012  . Chronic insomnia 07/06/2012    Past Surgical History  Procedure Laterality Date  . Cesarean section    . Appendectomy      Past Surgical History  Procedure Laterality Date  . Cesarean section    . Appendectomy      Current Outpatient Rx  Name  Route  Sig  Dispense  Refill  . atenolol (TENORMIN) 25 MG tablet   Oral   Take 1 tablet (25 mg total) by mouth daily.   90 tablet   3   . diclofenac (VOLTAREN) 75 MG EC tablet   Oral   Take 1 tablet (75 mg total) by mouth 2 (two) times daily.   60 tablet   2   . HYDROmorphone (DILAUDID) 2 MG tablet   Oral   Take 1 tablet (2 mg total) by mouth every 12 (twelve) hours as needed for severe pain.   20 tablet   0   . nortriptyline (PAMELOR) 50 MG capsule   Oral   Take 1 capsule (50 mg total) by mouth at bedtime.   30 capsule   0   . nortriptyline (PAMELOR) 75 MG capsule   Oral   Take 1 capsule (75 mg total) by mouth at  bedtime.   30 capsule   5     For the patient's refills after her 1 month supply ...     Allergies:  Sulfa antibiotics; Ampicillin; and Prednisone  Family History: Family History  Problem Relation Age of Onset  . Hypertension Mother   . Arthritis Mother   . Hypertension Father   . Vision loss Father     galucoma  . Hypertension Sister   . Alcohol abuse Brother   . Cancer Maternal Aunt 70    breast cancer  . Colon cancer Neg Hx   . Pancreatic cancer Neg Hx   . Stomach cancer Neg Hx     Social History: Social History  Substance Use Topics  . Smoking status: Current Every Day Smoker -- 0.03 packs/day for 34 years    Types: Cigarettes  . Smokeless tobacco: Never Used     Comment: stop 8 months ago  . Alcohol Use: No     Review of Systems:   10 point review of systems was performed and was otherwise negative:  Constitutional: No fever Eyes: No visual disturbances ENT: No sore throat, ear pain Cardiac: No chest pain  Respiratory: No shortness of breath, wheezing, or stridor Abdomen: No abdominal pain, no vomiting, No diarrhea Endocrine: No weight loss, No night sweats Extremities: No peripheral edema, cyanosis Skin: No rashes, easy bruising Neurologic: No focal weakness, trouble with speech or swollowing Urologic: No dysuria, Hematuria, or urinary frequency   Physical Exam:  ED Triage Vitals  Enc Vitals Group     BP 09/16/15 1221 141/66 mmHg     Pulse Rate 09/16/15 1221 64     Resp --      Temp 09/16/15 1221 97.5 F (36.4 C)     Temp Source 09/16/15 1221 Oral     SpO2 09/16/15 1221 99 %     Weight 09/16/15 1221 180 lb (81.647 kg)     Height 09/16/15 1221 5\' 6"  (1.676 m)     Head Cir --      Peak Flow --      Pain Score 09/16/15 1222 8     Pain Loc --      Pain Edu? --      Excl. in [place]? --     General: Awake , Alert , and Oriented times 3; GCS 15 Head: Normal cephalic , atraumatic Eyes: Pupils equal , round, reactive to light Nose/Throat: No  nasal drainage, patent upper airway without erythema or exudate.  Neck: Supple, Full range of motion, No anterior adenopathy or palpable thyroid masses Lungs: Clear to ascultation without wheezes , rhonchi, or rales Heart: Regular rate, regular rhythm without murmurs , gallops , or rubs Abdomen: Soft, non tender without rebound, guarding , or rigidity; bowel sounds positive and symmetric in all 4 quadrants. No organomegaly .        Extremities: 2 plus symmetric pulses. No edema, clubbing or cyanosis Neurologic: normal ambulation, Motor symmetric without deficits, sensory intact Skin: warm, dry, no rashes Back exam shows a positive straight leg raise on the right. She has good plantar flexion extension is able to ambulate. Reflexes are 2 out of 4 throughout.   Radiology:  CLINICAL DATA: Right calf pain. EDEMA. ON ASPIRIN.  EXAM: RIGHT LOWER EXTREMITY VENOUS DOPPLER ULTRASOUND  TECHNIQUE: Gray-scale sonography with compression, as well as color and duplex ultrasound, were performed to evaluate the deep venous system from the level of the common femoral vein through the popliteal and proximal calf veins.  COMPARISON: None  FINDINGS: Normal compressibility of the common femoral, superficial femoral, and popliteal veins, as well as the proximal calf veins. No filling defects to suggest DVT on grayscale or color Doppler imaging. Doppler waveforms show normal direction of venous flow, normal respiratory phasicity and response to augmentation. Survey views of the contralateral common femoral vein are unremarkable.  IMPRESSION: 1. No evidence of lower extremity deep vein thrombosis, RIGHT.    I personally reviewed the radiologic studies    ED Course: * Patient's stay here was uneventful and it appears that she has a picture consistent with sciatica. Felt was unlikely to be caudate equina syndrome. Her Doppler study is negative and there does not appear to be any vascular  deficiencies in the lower extremities.    Assessment: Sciatica Final Clinical Impression:   Final diagnoses:  Sciatica of right side     Plan: Outpatient Patient was advised to return immediately if condition worsens. Patient was advised to follow up with their primary care physician or other specialized physicians involved in their outpatient care. The patient and/or family member/power of attorney had laboratory results reviewed at the bedside. All questions and concerns  were addressed and appropriate discharge instructions were distributed by the nursing staff.            Daymon Larsen, MD 09/16/15 1455

## 2015-10-24 ENCOUNTER — Ambulatory Visit: Payer: Self-pay | Admitting: Family Medicine

## 2016-01-18 ENCOUNTER — Telehealth: Payer: Self-pay | Admitting: *Deleted

## 2016-01-18 MED ORDER — ATENOLOL 50 MG PO TABS: 25.0000 mg | ORAL_TABLET | Freq: Every day | ORAL | 3 refills | Status: DC

## 2016-01-18 NOTE — Telephone Encounter (Signed)
Received fax from [facility] stating that Atenolol 25 mg is on backorder.  Asking to change to Atenolol 50 mg taking 1/2 tablet daily.  Ok to change?

## 2016-01-18 NOTE — Telephone Encounter (Signed)
Ok to change

## 2016-06-13 ENCOUNTER — Other Ambulatory Visit: Payer: Self-pay

## 2016-06-13 MED ORDER — ATENOLOL 50 MG PO TABS: 25.0000 mg | ORAL_TABLET | Freq: Every day | ORAL | 0 refills | Status: AC

## 2016-06-13 NOTE — Telephone Encounter (Signed)
Pt request refill atenolol. Last annual 04/2015. Pt has no ins and will cb for appt prior to being out of med. Refilled # 15 to [facility] [address].

## 2016-06-15 ENCOUNTER — Other Ambulatory Visit: Payer: Self-pay | Admitting: Family Medicine

## 2016-09-11 ENCOUNTER — Emergency Department
Admission: EM | Admit: 2016-09-11 | Discharge: 2016-09-11 | Disposition: A | Discharge status: Home / Self Care | Payer: Self-pay | Attending: Emergency Medicine | Admitting: Emergency Medicine

## 2016-09-11 ENCOUNTER — Encounter: Payer: Self-pay | Admitting: Emergency Medicine

## 2016-09-11 DIAGNOSIS — Y9389 Activity, other specified: Secondary | ICD-10-CM | POA: Insufficient documentation

## 2016-09-11 DIAGNOSIS — Z87891 Personal history of nicotine dependence: Secondary | ICD-10-CM | POA: Insufficient documentation

## 2016-09-11 DIAGNOSIS — Y990 Civilian activity done for income or pay: Secondary | ICD-10-CM | POA: Insufficient documentation

## 2016-09-11 DIAGNOSIS — T148XXA Other injury of unspecified body region, initial encounter: Secondary | ICD-10-CM

## 2016-09-11 DIAGNOSIS — X503XXA Overexertion from repetitive movements, initial encounter: Secondary | ICD-10-CM | POA: Insufficient documentation

## 2016-09-11 DIAGNOSIS — M7080 Other soft tissue disorders related to use, overuse and pressure of unspecified site: Secondary | ICD-10-CM

## 2016-09-11 DIAGNOSIS — S6991XA Unspecified injury of right wrist, hand and finger(s), initial encounter: Secondary | ICD-10-CM | POA: Insufficient documentation

## 2016-09-11 DIAGNOSIS — I10 Essential (primary) hypertension: Secondary | ICD-10-CM | POA: Insufficient documentation

## 2016-09-11 DIAGNOSIS — Y929 Unspecified place or not applicable: Secondary | ICD-10-CM | POA: Insufficient documentation

## 2016-09-11 MED ORDER — NAPROXEN 500 MG PO TABS: 500.0000 mg | ORAL_TABLET | Freq: Two times a day (BID) | ORAL | Status: AC

## 2016-09-11 MED ORDER — OXYCODONE-ACETAMINOPHEN 7.5-325 MG PO TABS: 1.0000 | ORAL_TABLET | Freq: Four times a day (QID) | ORAL | 0 refills | Status: AC | PRN

## 2016-09-11 NOTE — ED Provider Notes (Signed)
[facility] Emergency Department Provider Note   ____________________________________________   First MD Initiated Contact with Patient 09/11/16 1432     (approximate)  I have reviewed the triage vital signs and the nursing notes.   HISTORY  Chief Complaint Hand Pain and Arm Pain    HPI Carla Weaver is a 56 y.o. female patient complaining of right hand pain that radiates to the mid forearm. Patient intubating complaint for over 2 months. Patient gives a history of repetitive motion due to picking up her headaches at work. Patient state pain is worse with movement. Patient stated mild transient relief with anti-inflammatory medications. She rates her pain discomfort as 7/10. Patient described a pain as "achy/sharp/shooting". Patient is right-hand dominant.   Past Medical History:  Diagnosis Date  . Anemia   . GERD (gastroesophageal reflux disease)   . Hypertension   . Severe dysplasia of cervix (CIN III) 10/13/2012   Needs LEEP    Patient Active Problem List   Diagnosis Date Noted  .  HX of Severe dysplasia of cervix (CIN III) 10/13/2012  . HTN (hypertension), benign 07/06/2012  . Chronic insomnia 07/06/2012    Past Surgical History:  Procedure Laterality Date  . APPENDECTOMY    . CESAREAN SECTION      Prior to Admission medications   Medication Sig Start Date End Date Taking? Authorizing Provider  atenolol (TENORMIN) 50 MG tablet Take 0.5 tablets (25 mg total) by mouth daily. 06/13/16   Amy Cletis [place], MD  diclofenac (VOLTAREN) 75 MG EC tablet Take 1 tablet (75 mg total) by mouth 2 (two) times daily. 08/09/15   Owens Loffler, MD  HYDROmorphone (DILAUDID) 2 MG tablet Take 1 tablet (2 mg total) by mouth every 12 (twelve) hours as needed for severe pain. 09/16/15   Daymon Larsen, MD  naproxen (NAPROSYN) 500 MG tablet Take 1 tablet (500 mg total) by mouth 2 (two) times daily with a meal. 09/11/16   Sable Feil, PA-C  nortriptyline (PAMELOR)  50 MG capsule Take 1 capsule (50 mg total) by mouth at bedtime. 09/12/15   Owens Loffler, MD  nortriptyline (PAMELOR) 75 MG capsule Take 1 capsule (75 mg total) by mouth at bedtime. 09/12/15   Owens Loffler, MD  oxyCODONE-acetaminophen (PERCOCET) 7.5-325 MG tablet Take 1 tablet by mouth every 6 (six) hours as needed for severe pain. 09/11/16   Sable Feil, PA-C    Allergies Sulfa antibiotics; Ampicillin; and Prednisone  Family History  Problem Relation Age of Onset  . Hypertension Mother   . Arthritis Mother   . Hypertension Father   . Vision loss Father     galucoma  . Hypertension Sister   . Alcohol abuse Brother   . Cancer Maternal Aunt 70    breast cancer  . Colon cancer Neg Hx   . Pancreatic cancer Neg Hx   . Stomach cancer Neg Hx     Social History Social History  Substance Use Topics  . Smoking status: Former Smoker    Packs/day: 0.03    Years: 34.00    Types: Cigarettes    Quit date: 09/12/2015  . Smokeless tobacco: Never Used     Comment: stop 8 months ago  . Alcohol use No    Review of Systems Constitutional: No fever/chills Eyes: No visual changes. ENT: No sore throat. Cardiovascular: Denies chest pain. Respiratory: Denies shortness of breath. Gastrointestinal: No abdominal pain.  No nausea, no vomiting.  No diarrhea.  No constipation.  Genitourinary: Negative for dysuria. Musculoskeletal: Right hand pain Skin: Negative for rash. Neurological: Negative for headaches, focal weakness or numbness. Endocrine:Hypertension Hematological/Lymphatic:Anemia  ____________________________________________   PHYSICAL EXAM:  VITAL SIGNS: ED Triage Vitals  Enc Vitals Group     BP 09/11/16 1413 (!) 167/72     Pulse Rate 09/11/16 1413 83     Resp 09/11/16 1413 18     Temp 09/11/16 1413 98.5 F (36.9 C)     Temp Source 09/11/16 1413 Oral     SpO2 09/11/16 1413 99 %     Weight 09/11/16 1414 173 lb (78.5 kg)     Height 09/11/16 1414 5\' 6"  (1.676 m)      Head Circumference --      Peak Flow --      Pain Score 09/11/16 1415 7     Pain Loc --      Pain Edu? --      Excl. in [place]? --     Constitutional: Alert and oriented. Well appearing and in no acute distress. Eyes: Conjunctivae are normal. PERRL. EOMI. Head: Atraumatic. Nose: No congestion/rhinnorhea. Mouth/Throat: Mucous membranes are moist.  Oropharynx non-erythematous. Neck: No stridor.  No cervical spine tenderness to palpation. Hematological/Lymphatic/Immunilogical: No cervical lymphadenopathy. Cardiovascular: Normal rate, regular rhythm. Grossly normal heart sounds.  Good peripheral circulation. Respiratory: Normal respiratory effort.  No retractions. Lungs CTAB. Gastrointestinal: Soft and nontender. No distention. No abdominal bruits. No CVA tenderness. Musculoskeletal: No obvious deformity to the right hand. Patient decreased range of motion to extension of the wrist. Negative Tinel's and negative Phalen's test. Neurologic:  Normal speech and language. No gross focal neurologic deficits are appreciated. No gait instability. Skin:  Skin is warm, dry and intact. No rash noted. Psychiatric: Mood and affect are normal. Speech and behavior are normal.  ____________________________________________   LABS (all labs ordered are listed, but only abnormal results are displayed)  Labs Reviewed - No data to display ____________________________________________  EKG   ____________________________________________  RADIOLOGY   ____________________________________________   PROCEDURES  Procedure(s) performed: None  Procedures  Critical Care performed: No  ____________________________________________   INITIAL IMPRESSION / ASSESSMENT AND PLAN / ED COURSE  Pertinent labs & imaging results that were available during my care of the patient were reviewed by me and considered in my medical decision making (see chart for details).  Right hand pain secondary to repetitive  motions.      ____________________________________________   FINAL CLINICAL IMPRESSION(S) / ED DIAGNOSES  Final diagnoses:  Repetitive motion injury  Patient given discharge Instructions. Patient placed in bilateral hand splint was discharge. Patient by the follow-up family doctor complaint persists.    NEW MEDICATIONS STARTED DURING THIS VISIT:  New Prescriptions   NAPROXEN (NAPROSYN) 500 MG TABLET    Take 1 tablet (500 mg total) by mouth 2 (two) times daily with a meal.   OXYCODONE-ACETAMINOPHEN (PERCOCET) 7.5-325 MG TABLET    Take 1 tablet by mouth every 6 (six) hours as needed for severe pain.     Note:  This document was prepared using Dragon voice recognition software and may include unintentional dictation errors.    Sable Feil, PA-C 09/11/16 [place], MD 09/15/16 [phone]

## 2016-09-11 NOTE — Discharge Instructions (Signed)
Use splints while working.

## 2016-09-11 NOTE — ED Notes (Signed)
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

## 2016-09-11 NOTE — ED Triage Notes (Signed)
Pt presents to ED via POV with c/o R hand pain, pt states pain starts in her hand and radiates all the way up her R arm. 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 No known injury at this time.

## 2016-10-26 ENCOUNTER — Encounter: Payer: Self-pay | Admitting: Emergency Medicine

## 2016-10-26 ENCOUNTER — Emergency Department
Admission: EM | Admit: 2016-10-26 | Discharge: 2016-10-26 | Disposition: A | Discharge status: Home / Self Care | Payer: Self-pay | Attending: Emergency Medicine | Admitting: Emergency Medicine

## 2016-10-26 DIAGNOSIS — I10 Essential (primary) hypertension: Secondary | ICD-10-CM | POA: Insufficient documentation

## 2016-10-26 DIAGNOSIS — L02212 Cutaneous abscess of back [any part, except buttock]: Secondary | ICD-10-CM | POA: Insufficient documentation

## 2016-10-26 DIAGNOSIS — F17210 Nicotine dependence, cigarettes, uncomplicated: Secondary | ICD-10-CM | POA: Insufficient documentation

## 2016-10-26 DIAGNOSIS — L720 Epidermal cyst: Secondary | ICD-10-CM | POA: Insufficient documentation

## 2016-10-26 DIAGNOSIS — Z79899 Other long term (current) drug therapy: Secondary | ICD-10-CM | POA: Insufficient documentation

## 2016-10-26 MED ORDER — CLINDAMYCIN HCL 300 MG PO CAPS: 300.0000 mg | ORAL_CAPSULE | Freq: Three times a day (TID) | ORAL | 0 refills | Status: AC

## 2016-10-26 MED ORDER — HYDROCODONE-ACETAMINOPHEN 5-325 MG PO TABS: 1.0000 | ORAL_TABLET | ORAL | 0 refills | Status: AC | PRN

## 2016-10-26 MED ORDER — LIDOCAINE HCL (PF) 1 % IJ SOLN
INTRAMUSCULAR | Status: AC
  Filled 2016-10-26: qty 5

## 2016-10-26 MED ORDER — LIDOCAINE HCL (PF) 1 % IJ SOLN
5.0000 mL | Freq: Once | INTRAMUSCULAR | Status: DC
  Filled 2016-10-26: qty 5

## 2016-10-26 NOTE — ED Triage Notes (Signed)
Pt presents to ED c/o pain from "knot" on upper R back. Pt states this knot has been there for 10-15 years. Has never had it looked at before.

## 2016-10-26 NOTE — ED Notes (Signed)
FIRST NURSE NOTE:  Patient has knot on upper middle back states it is has been there for a while, but states it hasn't bothered her before.

## 2016-10-26 NOTE — ED Provider Notes (Signed)
[facility] Emergency Department Provider Note  ____________________________________________  Time seen: Approximately 2:40 PM  I have reviewed the triage vital signs and the nursing notes.   HISTORY  Chief Complaint No chief complaint on file.   HPI Carla Weaver is a 56 y.o. female who presents to the emergency department for treatment and evaluation of a knot that has been on her upper back the past 10-15 years. She states up until the past 2 or 3 days it had not been sore or caused her any concern. She states that now it has become very tender and is red and warm to the touch. She has not taken any medications or tried any over the counter treatments.  Past Medical History:  Diagnosis Date  . Anemia   . GERD (gastroesophageal reflux disease)   . Hypertension   . Severe dysplasia of cervix (CIN III) 10/13/2012   Needs LEEP    Patient Active Problem List   Diagnosis Date Noted  .  HX of Severe dysplasia of cervix (CIN III) 10/13/2012  . HTN (hypertension), benign 07/06/2012  . Chronic insomnia 07/06/2012    Past Surgical History:  Procedure Laterality Date  . APPENDECTOMY    . CESAREAN SECTION      Prior to Admission medications   Medication Sig Start Date End Date Taking? Authorizing Provider  atenolol (TENORMIN) 50 MG tablet Take 0.5 tablets (25 mg total) by mouth daily. 06/13/16   Bedsole, Amy E, MD  clindamycin (CLEOCIN) 300 MG capsule Take 1 capsule (300 mg total) by mouth 3 (three) times daily. 10/26/16 11/05/16  Dianely Krehbiel, Dessa Phi, FNP  diclofenac (VOLTAREN) 75 MG EC tablet Take 1 tablet (75 mg total) by mouth 2 (two) times daily. 08/09/15   Copland, Frederico Hamman, MD  HYDROcodone-acetaminophen (NORCO/VICODIN) 5-325 MG tablet Take 1 tablet by mouth every 4 (four) hours as needed for moderate pain. 10/26/16 10/26/17  Adela Esteban, Johnette Abraham B, FNP  HYDROmorphone (DILAUDID) 2 MG tablet Take 1 tablet (2 mg total) by mouth every 12 (twelve) hours as needed for severe  pain. 09/16/15   Daymon Larsen, MD  naproxen (NAPROSYN) 500 MG tablet Take 1 tablet (500 mg total) by mouth 2 (two) times daily with a meal. 09/11/16   Sable Feil, PA-C  nortriptyline (PAMELOR) 50 MG capsule Take 1 capsule (50 mg total) by mouth at bedtime. 09/12/15   Copland, Frederico Hamman, MD  nortriptyline (PAMELOR) 75 MG capsule Take 1 capsule (75 mg total) by mouth at bedtime. 09/12/15   Copland, Frederico Hamman, MD  oxyCODONE-acetaminophen (PERCOCET) 7.5-325 MG tablet Take 1 tablet by mouth every 6 (six) hours as needed for severe pain. 09/11/16   Sable Feil, PA-C    Allergies Sulfa antibiotics; Ampicillin; and Prednisone  Family History  Problem Relation Age of Onset  . Hypertension Mother   . Arthritis Mother   . Hypertension Father   . Vision loss Father        galucoma  . Hypertension Sister   . Alcohol abuse Brother   . Cancer Maternal Aunt 70       breast cancer  . Colon cancer Neg Hx   . Pancreatic cancer Neg Hx   . Stomach cancer Neg Hx     Social History Social History  Substance Use Topics  . Smoking status: Current Every Day Smoker    Packs/day: 0.50    Years: 34.00    Types: Cigarettes    Last attempt to quit: 09/12/2015  . Smokeless tobacco:  Never Used     Comment: stop 8 months ago  . Alcohol use No    Review of Systems  Constitutional: Well appearing  Respiratory: Negative for shortness of breath or cough  Musculoskeletal: Negative for decrease in range of motion secondary to complaint  Skin: Positive for lesion on the upper back Neurological: Negative for changes in cognition. Negative for headache ____________________________________________   PHYSICAL EXAM:  VITAL SIGNS: ED Triage Vitals  Enc Vitals Group     BP 10/26/16 1420 (!) 170/67     Pulse Rate 10/26/16 1420 65     Resp 10/26/16 1420 18     Temp 10/26/16 1420 98.2 F (36.8 C)     Temp Source 10/26/16 1420 Oral     SpO2 10/26/16 1420 98 %     Weight 10/26/16 1421 173 lb (78.5 kg)      Height 10/26/16 1421 5\' 6"  (1.676 m)     Head Circumference --      Peak Flow --      Pain Score 10/26/16 1424 6     Pain Loc --      Pain Edu? --      Excl. in [place]? --      Constitutional: Well appearing Eyes: Conjunctivae are clear without discharge or drainage Nose: No rhinorrhea Mouth/Throat: Airway patent and mucous membranes are moist Neck: Active, full range of motion. No stridor. Lymphatic: No palpable cervical adenopathy  Cardiovascular: Capillary refill less than 2 seconds Respiratory: Respirations are even and unlabored. Musculoskeletal: Active, full range of motion throughout Neurologic: Alert, oriented 4. Skin:  10 cm oblong, mobile lesion with fluctuant, erythematous, tender area central to lower edge of the lesion with some surrounding erythema and tenderness  ____________________________________________   LABS (all labs ordered are listed, but only abnormal results are displayed)  Labs Reviewed - No data to display ____________________________________________  EKG  Not indicated ____________________________________________  RADIOLOGY  Not indicated ____________________________________________   PROCEDURES  Procedure(s) performed:  INCISION AND DRAINAGE Performed by: Sherrie George Consent: Verbal consent obtained. Risks and benefits: risks, benefits and alternatives were discussed Type: abscess  Body area: Right-sided upper back  Anesthesia: local infiltration  Incision was made with a scalpel.  Local anesthetic: lidocaine 1 % without epinephrine  Anesthetic total: 10 ml  Complexity: complex Blunt dissection to break up loculations  Drainage: purulent, Keratin   Drainage amount: Copious   Packing material: 1/4 in iodoform gauze  Patient tolerance: Patient tolerated the procedure well with no immediate complications.    ____________________________________________   INITIAL IMPRESSION / ASSESSMENT AND PLAN / ED COURSE  Carla Weaver is a 56 y.o. female who presents to the emergency department for evaluation of a lesion that has been present on her upper back for the past 10:15 years. As suspected, the lesion was an epidermal cyst that had developed some infection. Patient states that she has been out picking strawberries and believes this has caused the area to become worse. Copious amount of purulent drainage and keratin was expressed from the lesion. Iodoform gauze was inserted into the area and the patient was advised to be evaluated here by her primary care provider or here in the next 2-3 days. She'll be given a prescription for clindamycin and Norco. She was instructed to return to the emergency department or see primary care provider sooner if she has new concerns or the area seems to be worsening.   Pertinent labs & imaging results that were available during my  care of the patient were reviewed by me and considered in my medical decision making (see chart for details). ____________________________________________   FINAL CLINICAL IMPRESSION(S) / ED DIAGNOSES  Final diagnoses:  Epidermal cyst  Cutaneous abscess of back excluding buttocks    Discharge Medication List as of 10/26/2016  3:33 PM    START taking these medications   Details  clindamycin (CLEOCIN) 300 MG capsule Take 1 capsule (300 mg total) by mouth 3 (three) times daily., Starting Sun 10/26/2016, Until Wed 11/05/2016, Print    HYDROcodone-acetaminophen (NORCO/VICODIN) 5-325 MG tablet Take 1 tablet by mouth every 4 (four) hours as needed for moderate pain., Starting Sun 10/26/2016, Until Mon 10/26/2017, Print        If controlled substance prescribed during this visit, 12 month history viewed on the [place] prior to issuing an initial prescription for Schedule II or III opiod.   Note:  This document was prepared using Dragon voice recognition software and may include unintentional dictation errors.    Victorino Dike, FNP 10/27/16 1557     Harvest Dark, MD 10/28/16 2245

## 2017-06-01 ENCOUNTER — Other Ambulatory Visit: Payer: Self-pay

## 2017-06-01 ENCOUNTER — Emergency Department
Admission: EM | Admit: 2017-06-01 | Discharge: 2017-06-01 | Disposition: A | Discharge status: Home / Self Care | Payer: Self-pay | Attending: Emergency Medicine | Admitting: Emergency Medicine

## 2017-06-01 ENCOUNTER — Encounter: Payer: Self-pay | Admitting: Emergency Medicine

## 2017-06-01 DIAGNOSIS — J0100 Acute maxillary sinusitis, unspecified: Secondary | ICD-10-CM | POA: Insufficient documentation

## 2017-06-01 DIAGNOSIS — I10 Essential (primary) hypertension: Secondary | ICD-10-CM | POA: Insufficient documentation

## 2017-06-01 DIAGNOSIS — F17210 Nicotine dependence, cigarettes, uncomplicated: Secondary | ICD-10-CM | POA: Insufficient documentation

## 2017-06-01 DIAGNOSIS — Z791 Long term (current) use of non-steroidal anti-inflammatories (NSAID): Secondary | ICD-10-CM | POA: Insufficient documentation

## 2017-06-01 DIAGNOSIS — H9202 Otalgia, left ear: Secondary | ICD-10-CM | POA: Insufficient documentation

## 2017-06-01 DIAGNOSIS — Z79899 Other long term (current) drug therapy: Secondary | ICD-10-CM | POA: Insufficient documentation

## 2017-06-01 MED ORDER — FLUTICASONE PROPIONATE 50 MCG/ACT NA SUSP: 2.0000 | Freq: Every day | NASAL | 0 refills | Status: AC

## 2017-06-01 MED ORDER — BENZONATATE 100 MG PO CAPS: 100.0000 mg | ORAL_CAPSULE | Freq: Three times a day (TID) | ORAL | 0 refills | Status: AC | PRN

## 2017-06-01 MED ORDER — DOXYCYCLINE HYCLATE 100 MG PO TABS: 100.0000 mg | ORAL_TABLET | Freq: Two times a day (BID) | ORAL | 0 refills | Status: AC

## 2017-06-01 NOTE — ED Triage Notes (Signed)
Sinus congestion x 3 weeks, L ear pain began yesterday.

## 2017-06-01 NOTE — ED Notes (Signed)
See triage note  Presents with a 3 week hx of sinus pressure and possible infection  States she developed left ear pain yesterday  Afebrile on arrival

## 2017-06-01 NOTE — ED Provider Notes (Signed)
[facility] Emergency Department Provider Note ____________________________________________  Time seen: 106  I have reviewed the triage vital signs and the nursing notes.  HISTORY  Chief Complaint  Otalgia  HPI Carla Weaver is a 57 y.o. female sent to the ED for evaluation of a 3-week complaint of sinus congestion without fevers.  She reports sinus pressure without sinus drainage or postnasal drip.  She also reports some mild nonproductive cough.  She developed left ear pain yesterday and is concerned for possible infection.  She describes left ear pain with movement of the tragus and pinna.  She denies any interim fevers, chills, or sweats.  She did not receive the seasonal flu vaccine.  Past Medical History:  Diagnosis Date  . Anemia   . GERD (gastroesophageal reflux disease)   . Hypertension   . Severe dysplasia of cervix (CIN III) 10/13/2012   Needs LEEP    Patient Active Problem List   Diagnosis Date Noted  .  HX of Severe dysplasia of cervix (CIN III) 10/13/2012  . HTN (hypertension), benign 07/06/2012  . Chronic insomnia 07/06/2012    Past Surgical History:  Procedure Laterality Date  . APPENDECTOMY    . CESAREAN SECTION      Prior to Admission medications   Medication Sig Start Date End Date Taking? Authorizing Provider  atenolol (TENORMIN) 50 MG tablet Take 0.5 tablets (25 mg total) by mouth daily. 06/13/16   Bedsole, Amy E, MD  benzonatate (TESSALON PERLES) 100 MG capsule Take 1 capsule (100 mg total) by mouth 3 (three) times daily as needed for cough (Take 1-2 per dose). 06/01/17   Noe Pittsley, Dannielle Karvonen, PA-C  diclofenac (VOLTAREN) 75 MG EC tablet Take 1 tablet (75 mg total) by mouth 2 (two) times daily. 08/09/15   Copland, Frederico Hamman, MD  doxycycline (VIBRA-TABS) 100 MG tablet Take 1 tablet (100 mg total) by mouth 2 (two) times daily for 7 days. 06/01/17 06/08/17  Dainel Arcidiacono, Dannielle Karvonen, PA-C  fluticasone (FLONASE) 50 MCG/ACT nasal spray Place 2  sprays into both nostrils daily. 06/01/17   Nikisha Fleece, Dannielle Karvonen, PA-C  HYDROcodone-acetaminophen (NORCO/VICODIN) 5-325 MG tablet Take 1 tablet by mouth every 4 (four) hours as needed for moderate pain. 10/26/16 10/26/17  Triplett, Johnette Abraham B, FNP  HYDROmorphone (DILAUDID) 2 MG tablet Take 1 tablet (2 mg total) by mouth every 12 (twelve) hours as needed for severe pain. 09/16/15   Daymon Larsen, MD  naproxen (NAPROSYN) 500 MG tablet Take 1 tablet (500 mg total) by mouth 2 (two) times daily with a meal. 09/11/16   Sable Feil, PA-C  nortriptyline (PAMELOR) 50 MG capsule Take 1 capsule (50 mg total) by mouth at bedtime. 09/12/15   Copland, Frederico Hamman, MD  nortriptyline (PAMELOR) 75 MG capsule Take 1 capsule (75 mg total) by mouth at bedtime. 09/12/15   Copland, Frederico Hamman, MD  oxyCODONE-acetaminophen (PERCOCET) 7.5-325 MG tablet Take 1 tablet by mouth every 6 (six) hours as needed for severe pain. 09/11/16   Sable Feil, PA-C    Allergies Sulfa antibiotics; Ampicillin; and Prednisone  Family History  Problem Relation Age of Onset  . Hypertension Mother   . Arthritis Mother   . Hypertension Father   . Vision loss Father        galucoma  . Hypertension Sister   . Alcohol abuse Brother   . Cancer Maternal Aunt 70       breast cancer  . Colon cancer Neg Hx   . Pancreatic  cancer Neg Hx   . Stomach cancer Neg Hx     Social History Social History   Tobacco Use  . Smoking status: Current Every Day Smoker    Packs/day: 1.00    Years: 34.00    Pack years: 34.00    Types: Cigarettes    Last attempt to quit: 09/12/2015    Years since quitting: 1.7  . Smokeless tobacco: Never Used  . Tobacco comment: stop 8 months ago  Substance Use Topics  . Alcohol use: No    Alcohol/week: 0.0 oz  . Drug use: No    Review of Systems  Constitutional: Negative for fever. Eyes: Negative for visual changes. ENT: Negative for sore throat.  Reports sinus congestion and left ear pain. Cardiovascular: Negative  for chest pain. Respiratory: Negative for shortness of breath. Musculoskeletal: Negative for back pain. Skin: Negative for rash. Neurological: Negative for headaches, focal weakness or numbness. ____________________________________________  PHYSICAL EXAM:  VITAL SIGNS: ED Triage Vitals  Enc Vitals Group     BP 06/01/17 1747 (!) 146/68     Pulse Rate 06/01/17 1747 79     Resp 06/01/17 1747 20     Temp 06/01/17 1747 98.6 F (37 C)     Temp Source 06/01/17 1747 Oral     SpO2 06/01/17 1747 98 %     Weight 06/01/17 1748 180 lb (81.6 kg)     Height 06/01/17 1748 5\' 6"  (1.676 m)     Head Circumference --      Peak Flow --      Pain Score 06/01/17 1747 8     Pain Loc --      Pain Edu? --      Excl. in [place]? --    Constitutional: Alert and oriented. Well appearing and in no distress. Head: Normocephalic and atraumatic. Eyes: Conjunctivae are normal. PERRL. Normal extraocular movements Ears: Canals clear. TMs intact bilaterally.  Left ear with some soft brown wax noted in the proximal canal.  Patient is exquisitely tender to palpation to the posterior proximal ear canal wall.  No visible defects noted but patient may have a early papule formation.  She is tender to palpation with the otoscope tip to the posterior ear canal. Nose: No congestion/rhinorrhea/epistaxis. Mouth/Throat: Mucous membranes are moist.  Uvula is midline and tonsils are flat.  No oropharyngeal lesions are appreciated. Neck: Supple. No thyromegaly. Hematological/Lymphatic/Immunological: No cervical lymphadenopathy. Cardiovascular: Normal rate, regular rhythm. Normal distal pulses. Respiratory: Normal respiratory effort. No wheezes/rales/rhonchi. Musculoskeletal: Nontender with normal range of motion in all extremities.  Neurologic:  Normal gait without ataxia. Normal speech and language. No gross focal neurologic deficits are appreciated. Skin:  Skin is warm, dry and intact. No rash  noted. ____________________________________________  INITIAL IMPRESSION / ASSESSMENT AND PLAN / ED COURSE  ED evaluation of a 3-week complaint of sinus congestion and pressure along with some sudden left ear pain.  Her exam is overall benign showing no acute otitis media on the left.  Patient has ear canal tenderness which likely represents an early papule formation.  She will be discharged with prescriptions for Tessalon Perles, Flonase, and amoxicillin for probable sinusitis.  She is encouraged to continue her home medications for symptom relief.  She may dose ibuprofen for sinus pressure and is encouraged to use warm mist facial to help reduce sinus pressure.  She will follow-up with primary provider for ongoing symptoms. ____________________________________________  FINAL CLINICAL IMPRESSION(S) / ED DIAGNOSES  Final diagnoses:  Otalgia of left ear  Acute maxillary sinusitis, recurrence not specified      Melvenia Needles, PA-C 06/01/17 1934    Eula Listen, MD 06/01/17 [phone]

## 2017-06-01 NOTE — Discharge Instructions (Signed)
Your exam is consistent with a sinusitis and ear pain due to a small pimple. Take the prescription meds as directed. Apply warm compresses and use steam facials to reduce sinus pressure. See your provider for continued symptoms.

## 2020-03-13 ENCOUNTER — Emergency Department
Admission: EM | Admit: 2020-03-13 | Discharge: 2020-03-13 | Disposition: A | Discharge status: Home / Self Care | Payer: Self-pay | Attending: Emergency Medicine | Admitting: Emergency Medicine

## 2020-03-13 ENCOUNTER — Emergency Department: Payer: Self-pay

## 2020-03-13 ENCOUNTER — Encounter: Payer: Self-pay | Admitting: *Deleted

## 2020-03-13 ENCOUNTER — Other Ambulatory Visit: Payer: Self-pay

## 2020-03-13 DIAGNOSIS — F17210 Nicotine dependence, cigarettes, uncomplicated: Secondary | ICD-10-CM | POA: Insufficient documentation

## 2020-03-13 DIAGNOSIS — M25572 Pain in left ankle and joints of left foot: Secondary | ICD-10-CM | POA: Insufficient documentation

## 2020-03-13 DIAGNOSIS — Z79899 Other long term (current) drug therapy: Secondary | ICD-10-CM | POA: Insufficient documentation

## 2020-03-13 DIAGNOSIS — I10 Essential (primary) hypertension: Secondary | ICD-10-CM | POA: Insufficient documentation

## 2020-03-13 MED ORDER — TRAMADOL HCL 50 MG PO TABS: 50.0000 mg | ORAL_TABLET | Freq: Four times a day (QID) | ORAL | 0 refills | Status: AC | PRN

## 2020-03-13 MED ORDER — MELOXICAM 15 MG PO TABS: 15.0000 mg | ORAL_TABLET | Freq: Every day | ORAL | 2 refills | Status: AC

## 2020-03-13 MED ORDER — LIDOCAINE 5 % EX PTCH
1.0000 | MEDICATED_PATCH | CUTANEOUS | Status: DC
  Administered 2020-03-13: 1 via TRANSDERMAL
  Filled 2020-03-13: qty 1

## 2020-03-13 MED ORDER — OXYCODONE-ACETAMINOPHEN 5-325 MG PO TABS
1.0000 | ORAL_TABLET | ORAL | Status: DC | PRN
  Administered 2020-03-13: 1 via ORAL
  Filled 2020-03-13: qty 1

## 2020-03-13 NOTE — ED Triage Notes (Signed)
Pt to ED reporting worsening pain in left ankle after she was hit with a large piece of wood on Saturday. Pt reports while working over the weekend there was a large piece of Oak that was shot from a machine and hit her left ankle. Pt attempted to walk on ankle and reports the pain has become unbearable.

## 2020-03-13 NOTE — Discharge Instructions (Signed)
Follow discharge care instruction.  Use heat instead of ice.  Take medication as directed.  Follow-up with PCP if no improvement in 3 to 5 days.

## 2020-03-13 NOTE — ED Notes (Signed)
Pt transported to Xray. 

## 2020-03-13 NOTE — ED Provider Notes (Signed)
[facility] Emergency Department Provider Note   ____________________________________________   First MD Initiated Contact with Patient 03/13/20 [phone]     (approximate)  I have reviewed the triage vital signs and the nursing notes.   HISTORY  Chief Complaint Ankle Pain    HPI Carla Weaver is a 59 y.o. female patient currently complaining of left ankle pain secondary to contusion.  Patient states this occurred at  when she was hit by a piece of wood discharge from machine.  Patient states pain increased with weightbearing and ambulation.  Patient rates pain as a 9/10.  Patient described pain is "achy".  No palliative measures for complaint.         Past Medical History:  Diagnosis Date  . Anemia   . GERD (gastroesophageal reflux disease)   . Hypertension   . Severe dysplasia of cervix (CIN III) 10/13/2012   Needs LEEP    Patient Active Problem List   Diagnosis Date Noted  .  HX of Severe dysplasia of cervix (CIN III) 10/13/2012  . HTN (hypertension), benign 07/06/2012  . Chronic insomnia 07/06/2012    Past Surgical History:  Procedure Laterality Date  . APPENDECTOMY    . CESAREAN SECTION      Prior to Admission medications   Medication Sig Start Date End Date Taking? Authorizing Provider  atenolol (TENORMIN) 50 MG tablet Take 0.5 tablets (25 mg total) by mouth daily. 06/13/16   Bedsole, Amy E, MD  benzonatate (TESSALON PERLES) 100 MG capsule Take 1 capsule (100 mg total) by mouth 3 (three) times daily as needed for cough (Take 1-2 per dose). 06/01/17   Menshew, Dannielle Karvonen, PA-C  diclofenac (VOLTAREN) 75 MG EC tablet Take 1 tablet (75 mg total) by mouth 2 (two) times daily. 08/09/15   Copland, Frederico Hamman, MD  fluticasone (FLONASE) 50 MCG/ACT nasal spray Place 2 sprays into both nostrils daily. 06/01/17   Menshew, Dannielle Karvonen, PA-C  HYDROmorphone (DILAUDID) 2 MG tablet Take 1 tablet (2 mg total) by mouth every 12 (twelve) hours as needed for  severe pain. 09/16/15   Daymon Larsen, MD  meloxicam (MOBIC) 15 MG tablet Take 1 tablet (15 mg total) by mouth daily. 03/13/20 03/13/21  Sable Feil, PA-C  naproxen (NAPROSYN) 500 MG tablet Take 1 tablet (500 mg total) by mouth 2 (two) times daily with a meal. 09/11/16   Sable Feil, PA-C  nortriptyline (PAMELOR) 50 MG capsule Take 1 capsule (50 mg total) by mouth at bedtime. 09/12/15   Copland, Frederico Hamman, MD  nortriptyline (PAMELOR) 75 MG capsule Take 1 capsule (75 mg total) by mouth at bedtime. 09/12/15   Copland, Frederico Hamman, MD  oxyCODONE-acetaminophen (PERCOCET) 7.5-325 MG tablet Take 1 tablet by mouth every 6 (six) hours as needed for severe pain. 09/11/16   Sable Feil, PA-C  traMADol (ULTRAM) 50 MG tablet Take 1 tablet (50 mg total) by mouth every 6 (six) hours as needed for moderate pain. 03/13/20   Sable Feil, PA-C    Allergies Sulfa antibiotics, Ampicillin, and Prednisone  Family History  Problem Relation Age of Onset  . Hypertension Mother   . Arthritis Mother   . Hypertension Father   . Vision loss Father        galucoma  . Hypertension Sister   . Alcohol abuse Brother   . Cancer Maternal Aunt 70       breast cancer  . Colon cancer Neg Hx   . Pancreatic cancer  Neg Hx   . Stomach cancer Neg Hx     Social History Social History   Tobacco Use  . Smoking status: Current Every Day Smoker    Packs/day: 1.00    Years: 34.00    Pack years: 34.00    Types: Cigarettes    Last attempt to quit: 09/12/2015    Years since quitting: 4.5  . Smokeless tobacco: Never Used  . Tobacco comment: stop 8 months ago  Substance Use Topics  . Alcohol use: No    Alcohol/week: 0.0 standard drinks  . Drug use: No    Review of Systems  Constitutional: No fever/chills Eyes: No visual changes. ENT: No sore throat. Cardiovascular: Denies chest pain. Respiratory: Denies shortness of breath. Gastrointestinal: No abdominal pain.  No nausea, no vomiting.  No diarrhea.  No  constipation. Genitourinary: Negative for dysuria. Musculoskeletal: Left ankle pain.   Skin: Negative for rash. Neurological: Negative for headaches, focal weakness or numbness. Endocrine:  Hypertension Allergic/Immunilogical: Ampicillin, prednisone, and sulfur antibiotics. ____________________________________________   PHYSICAL EXAM:  VITAL SIGNS: ED Triage Vitals [03/13/20 0616]  Enc Vitals Group     BP (!) 172/74     Pulse Rate 81     Resp 16     Temp 98.8 F (37.1 C)     Temp Source Oral     SpO2 100 %     Weight 179 lb 14.3 oz (81.6 kg)     Height 5\' 6"  (1.676 m)     Head Circumference      Peak Flow      Pain Score 9     Pain Loc      Pain Edu?      Excl. in [place]?     Constitutional: Alert and oriented. Well appearing and in no acute distress. Hematological/Lymphatic/Immunilogical: No cervical lymphadenopathy. Cardiovascular: Elevated blood pressure.  Normal rate, regular rhythm. Grossly normal heart sounds.  Good peripheral circulation. Respiratory: Normal respiratory effort.  No retractions. Lungs CTAB. Musculoskeletal: No lower extremity tenderness nor edema.  No joint effusions. Neurologic:  Normal speech and language. No gross focal neurologic deficits are appreciated. No gait instability. Skin:  Skin is warm, dry and intact. No rash noted. Psychiatric: Mood and affect are normal. Speech and behavior are normal.  ____________________________________________   LABS (all labs ordered are listed, but only abnormal results are displayed)  Labs Reviewed - No data to display ____________________________________________  EKG   ____________________________________________  RADIOLOGY I, Sable Feil, personally viewed and evaluated these images (plain radiographs) as part of my medical decision making, as well as reviewing the written report by the radiologist.  ED MD interpretation: No acute findings x-ray of the left ankle. Official radiology  report(s): DG Ankle Complete Left  Result Date: 03/13/2020 CLINICAL DATA:  Left ankle injury. EXAM: LEFT ANKLE COMPLETE - 3+ VIEW COMPARISON:  No prior. FINDINGS: No acute bony or joint abnormality identified. No evidence of fracture or dislocation. Diffuse soft tissue swelling. IMPRESSION: No acute abnormality. Electronically Signed   By: Marcello Moores  Register   On: 03/13/2020 06:45    ____________________________________________   PROCEDURES  Procedure(s) performed (including Critical Care):  Procedures   ____________________________________________   INITIAL IMPRESSION / ASSESSMENT AND PLAN / ED COURSE  As part of my medical decision making, I reviewed the following data within the [place] Course as of Mar 13 728  Tue Mar 13, 2020  0706 DG  Ankle Complete Left [HS]    Clinical Course User Index [HS] Sherrill Raring, Student-PA     ____________________________________________   FINAL CLINICAL IMPRESSION(S) / ED DIAGNOSES  Final diagnoses:  Acute left ankle pain   Patient presents with left ankle pain secondary to contusion foot 3 days ago.  Discussed x-ray findings with patient.  Patient complaint physical exam consistent with contusion and abrasion to the left ankle.  Patient given discharge care instruction work note.  Patient vies follow-up with PCP if no improvement in 3 to 5 days.  ED Discharge Orders         Ordered    traMADol (ULTRAM) 50 MG tablet  Every 6 hours PRN        03/13/20 0726    meloxicam (MOBIC) 15 MG tablet  Daily        03/13/20 0726          *Please note:  Carla Weaver was evaluated in Emergency Department on 03/13/2020 for the symptoms described in the history of present illness. She was evaluated in the context of the global COVID-19 pandemic, which necessitated consideration that the patient might be at risk for infection with the SARS-CoV-2 virus that causes COVID-19. Institutional protocols and  algorithms that pertain to the evaluation of patients at risk for COVID-19 are in a state of rapid change based on information released by regulatory bodies including the CDC and federal and state organizations. These policies and algorithms were followed during the patient's care in the ED.  Some ED evaluations and interventions may be delayed as a result of limited staffing during and the pandemic.*   Note:  This document was prepared using Dragon voice recognition software and may include unintentional dictation errors.    Sable Feil, PA-C 03/13/20 4536    Duffy Bruce, MD 03/13/20 [phone]

## 2022-01-08 IMAGING — CR DG ANKLE COMPLETE 3+V*L*
3 series · 3 of 3 positions shown · non-contrast
Comparison: No prior.

CLINICAL DATA: Left ankle injury.

EXAM:
LEFT ANKLE COMPLETE - 3+ VIEW

[ankle ap]
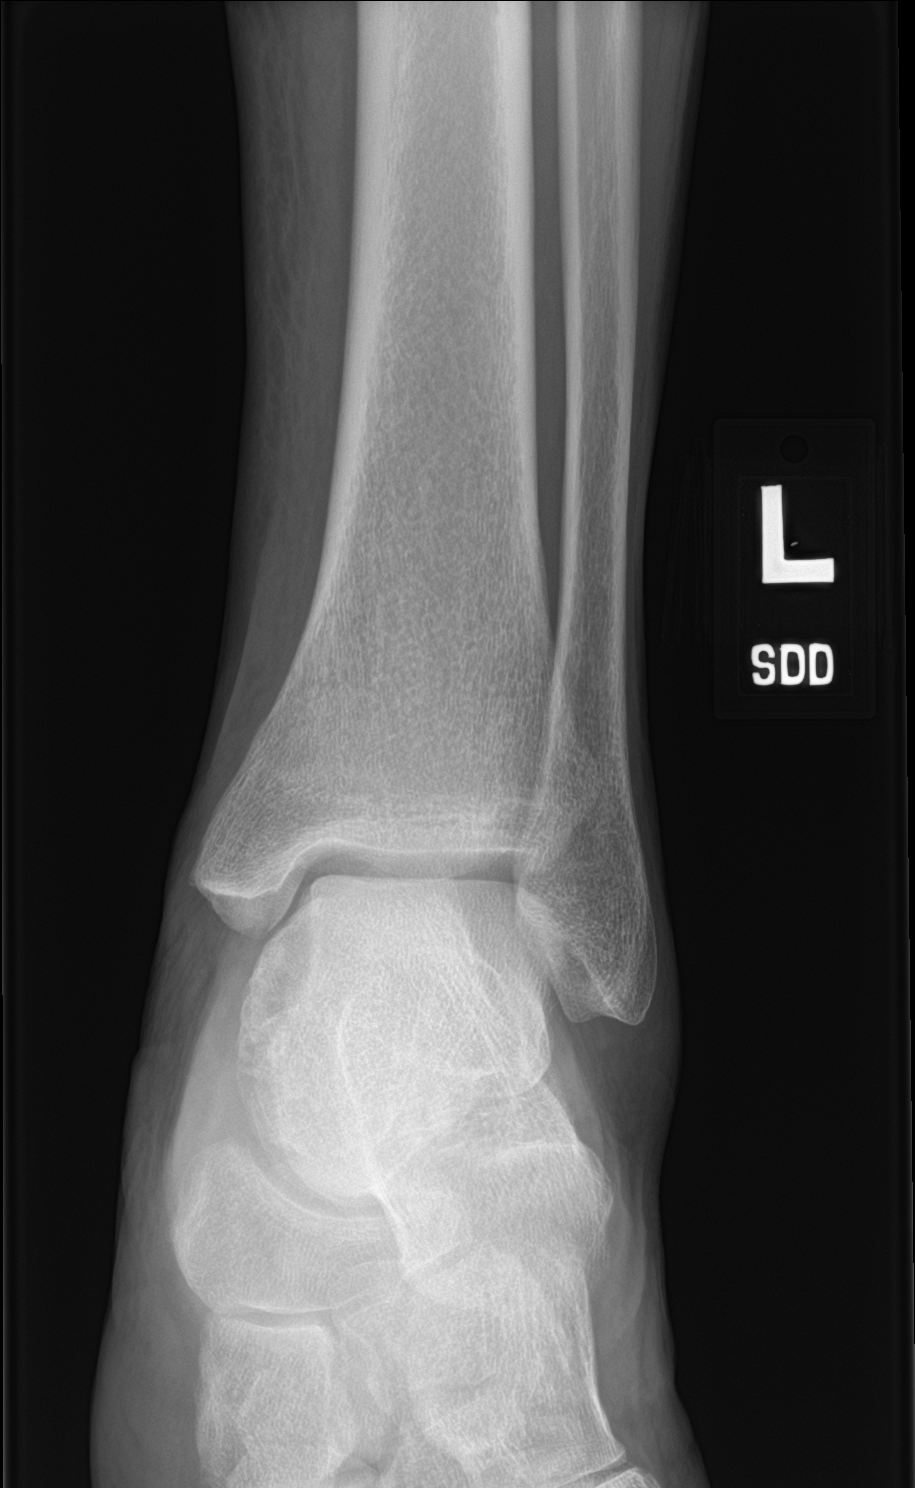

[ankle obl]
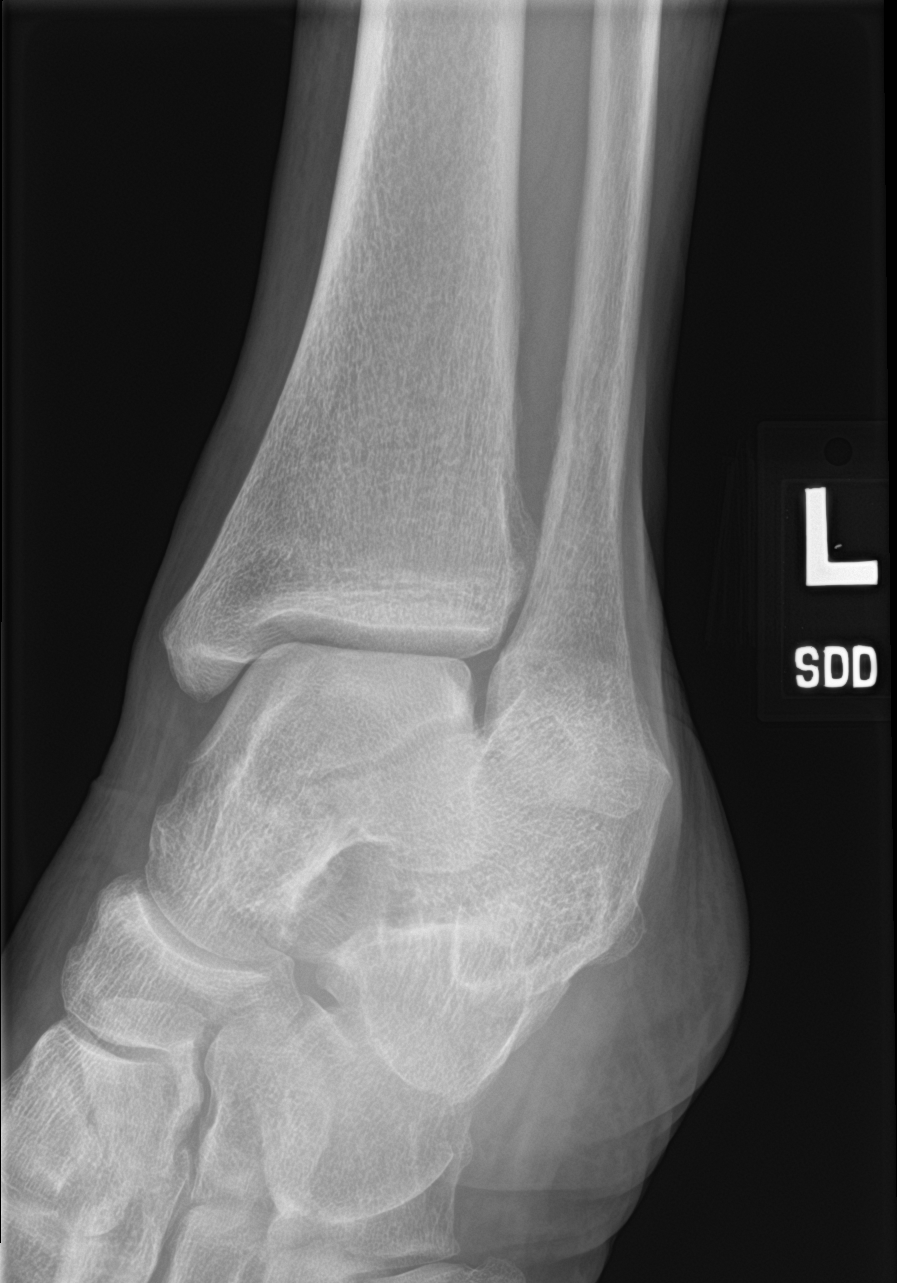

[ankle lat]
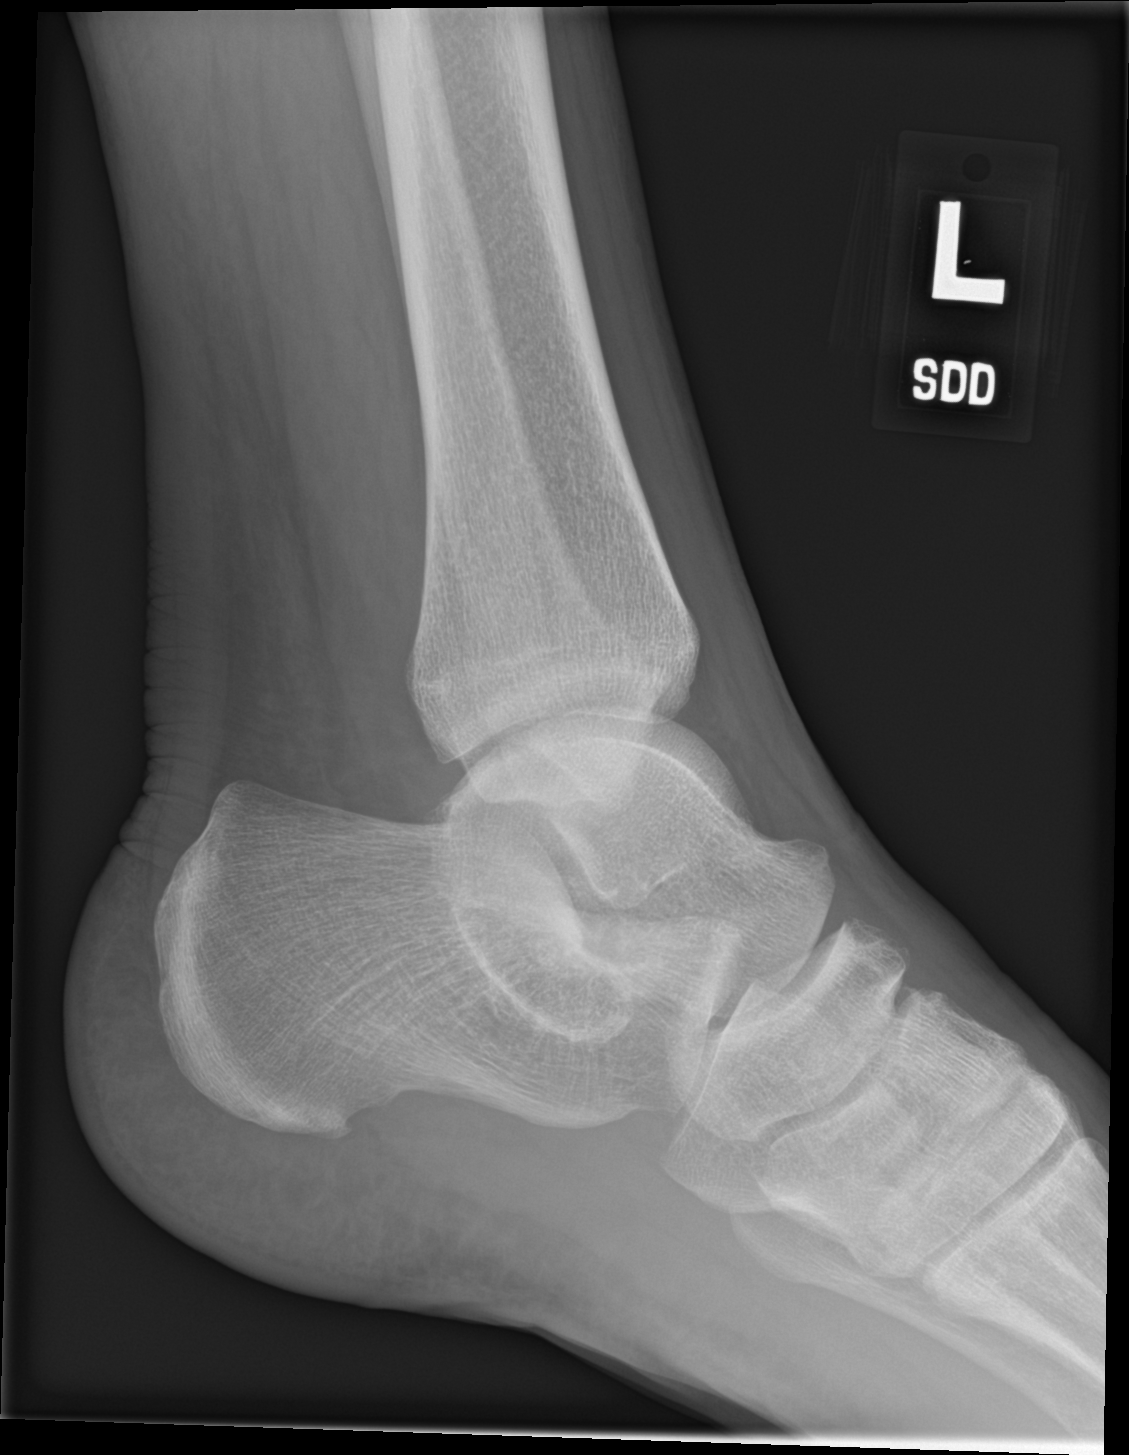

[3 of 3 positions shown; findings below may reference images not displayed]

FINDINGS: No acute bony or joint abnormality identified. No evidence of
fracture or dislocation. Diffuse soft tissue swelling.
IMPRESSION: No acute abnormality.

## 2023-12-26 ENCOUNTER — Encounter: Payer: Self-pay | Admitting: Internal Medicine

## 2024-05-05 ENCOUNTER — Telehealth: Payer: Self-pay

## 2024-05-05 NOTE — Telephone Encounter (Signed)
 Telephone call to patient to inform her that her colonoscopy with Dr. Avram is due per recall assessment sheet.  VM obtained, VM box is full and RN unable to leave a message.  However, a recall letter was sent to patient.

## 2024-05-15 ENCOUNTER — Emergency Department: Payer: Self-pay

## 2024-05-15 ENCOUNTER — Emergency Department
Admission: EM | Admit: 2024-05-15 | Discharge: 2024-05-16 | Disposition: A | Discharge status: Other Acute Inpatient Hospital | Payer: Self-pay | Attending: Emergency Medicine | Admitting: Emergency Medicine

## 2024-05-15 ENCOUNTER — Encounter: Payer: Self-pay | Admitting: Emergency Medicine

## 2024-05-15 ENCOUNTER — Other Ambulatory Visit: Payer: Self-pay

## 2024-05-15 DIAGNOSIS — C9500 Acute leukemia of unspecified cell type not having achieved remission: Secondary | ICD-10-CM | POA: Insufficient documentation

## 2024-05-15 DIAGNOSIS — D696 Thrombocytopenia, unspecified: Secondary | ICD-10-CM | POA: Insufficient documentation

## 2024-05-15 DIAGNOSIS — D72829 Elevated white blood cell count, unspecified: Secondary | ICD-10-CM

## 2024-05-15 DIAGNOSIS — I10 Essential (primary) hypertension: Secondary | ICD-10-CM | POA: Insufficient documentation

## 2024-05-15 DIAGNOSIS — R531 Weakness: Secondary | ICD-10-CM | POA: Insufficient documentation

## 2024-05-15 DIAGNOSIS — F17210 Nicotine dependence, cigarettes, uncomplicated: Secondary | ICD-10-CM | POA: Insufficient documentation

## 2024-05-15 LAB — DIFFERENTIAL
Basophils Absolute: 0 K/uL (ref 0.0–0.1)
Basophils Relative: 0 %
Eosinophils Absolute: 0 K/uL (ref 0.0–0.5)
Eosinophils Relative: 0 %
Lymphocytes Relative: 1 %
Lymphs Abs: 1.4 K/uL (ref 0.7–4.0)
Monocytes Absolute: 0 K/uL — ABNORMAL LOW (ref 0.1–1.0)
Monocytes Relative: 0 %
Neutro Abs: 2.8 K/uL (ref 1.7–7.7)
Neutrophils Relative %: 2 %
Other: 97 %

## 2024-05-15 LAB — CBC
HCT: 30 % — ABNORMAL LOW (ref 36.0–46.0)
Hemoglobin: 10.3 g/dL — ABNORMAL LOW (ref 12.0–15.0)
MCH: 31.2 pg (ref 26.0–34.0)
MCHC: 34.3 g/dL (ref 30.0–36.0)
MCV: 90.9 fL (ref 80.0–100.0)
Platelets: 34 K/uL — ABNORMAL LOW (ref 150–400)
RBC: 3.3 MIL/uL — ABNORMAL LOW (ref 3.87–5.11)
RDW: 15.2 % (ref 11.5–15.5)
WBC: 139.9 K/uL (ref 4.0–10.5)
nRBC: 0.5 % — ABNORMAL HIGH (ref 0.0–0.2)

## 2024-05-15 LAB — COMPREHENSIVE METABOLIC PANEL WITH GFR
ALT: 22 U/L (ref 0–44)
AST: 26 U/L (ref 15–41)
Albumin: 4.5 g/dL (ref 3.5–5.0)
Alkaline Phosphatase: 92 U/L (ref 38–126)
Anion gap: 15 (ref 5–15)
BUN: 19 mg/dL (ref 6–20)
CO2: 21 mmol/L — ABNORMAL LOW (ref 22–32)
Calcium: 9.4 mg/dL (ref 8.9–10.3)
Chloride: 99 mmol/L (ref 98–111)
Creatinine, Ser: 1.19 mg/dL — ABNORMAL HIGH (ref 0.44–1.00)
GFR, Estimated: 52 mL/min — ABNORMAL LOW
Glucose, Bld: 114 mg/dL — ABNORMAL HIGH (ref 70–99)
Potassium: 3.3 mmol/L — ABNORMAL LOW (ref 3.5–5.1)
Sodium: 136 mmol/L (ref 135–145)
Total Bilirubin: 0.4 mg/dL (ref 0.0–1.2)
Total Protein: 7.5 g/dL (ref 6.5–8.1)

## 2024-05-15 LAB — FIBRINOGEN: Fibrinogen: 302 mg/dL (ref 210–475)

## 2024-05-15 LAB — IMMATURE PLATELET FRACTION: Immature Platelet Fraction: 4.6 % (ref 1.2–8.6)

## 2024-05-15 LAB — ETHANOL: Alcohol, Ethyl (B): <15 mg/dL

## 2024-05-15 LAB — PROTIME-INR
INR: 1.2 (ref 0.8–1.2)
Prothrombin Time: 16.2 s — ABNORMAL HIGH (ref 11.4–15.2)

## 2024-05-15 LAB — CBG MONITORING, ED: Glucose-Capillary: 114 mg/dL — ABNORMAL HIGH (ref 70–99)

## 2024-05-15 LAB — PHOSPHORUS: Phosphorus: 3.8 mg/dL (ref 2.5–4.6)

## 2024-05-15 LAB — LACTATE DEHYDROGENASE: LDH: 422 U/L — ABNORMAL HIGH (ref 105–235)

## 2024-05-15 LAB — APTT: aPTT: 26 s (ref 24–36)

## 2024-05-15 MED ORDER — ONDANSETRON HCL 4 MG/2ML IJ SOLN
4.0000 mg | Freq: Once | INTRAMUSCULAR | Status: AC
  Administered 2024-05-15: 4 mg via INTRAVENOUS
  Filled 2024-05-15: qty 2

## 2024-05-15 MED ORDER — SODIUM CHLORIDE 0.9 % IV BOLUS
1000.0000 mL | Freq: Once | INTRAVENOUS | Status: AC
  Administered 2024-05-15: 1000 mL via INTRAVENOUS

## 2024-05-15 MED ORDER — HYDROXYUREA 500 MG PO CAPS
2000.0000 mg | ORAL_CAPSULE | ORAL | Status: AC
  Administered 2024-05-15: 2000 mg via ORAL
  Filled 2024-05-15: qty 4

## 2024-05-15 MED ORDER — ALLOPURINOL 300 MG PO TABS
300.0000 mg | ORAL_TABLET | Freq: Every day | ORAL | Status: DC
  Administered 2024-05-15: 300 mg via ORAL
  Filled 2024-05-15 (×2): qty 1

## 2024-05-15 MED ORDER — HYDROXYUREA 500 MG PO CAPS: 2000.0000 mg | ORAL_CAPSULE | Freq: Every day | ORAL | Status: DC

## 2024-05-15 MED ORDER — LACTATED RINGERS IV SOLN: INTRAVENOUS | Status: DC

## 2024-05-15 NOTE — ED Triage Notes (Addendum)
 Pt reports she woke up this morning around 8 am with generalized weakness (mobility intact in all extremities) and difficulty with coordination. Sts she was unable to tie her shoe. Difficulty with walking. Typically does not have this issue. Doesn't requires a cane nor walker for ambulation. Pt accompanied by daughter Delon.   LKN Went to sleep 9:30 pm 12/20 feeling her normal self. A&Ox4.

## 2024-05-15 NOTE — ED Notes (Signed)
 Tele cart at bedside.

## 2024-05-15 NOTE — ED Notes (Signed)
 Called CCMD to place pt on central monitoring

## 2024-05-15 NOTE — ED Provider Notes (Signed)
 "  [facility] Provider Note    Event Date/Time   First MD Initiated Contact with Patient 05/15/24 2047     (approximate)   History   Chief Complaint: Weakness and Gait Problem   HPI  Carla Weaver is a 63 y.o. female with past history of hypertension and smoking who comes ED complaining of fatigue and feeling out of sorts today.  She reports being in her usual state of health up until about 3 days ago when she developed malaise, loss of appetite.  No fever or chills or pain at that time.  Has had decreased oral intake since then.  Went to bed in that condition, and when she woke up this morning around 8 AM, she felt that she was sluggish and weak across her entire body.  Reports difficulty with ambulation and feeling uncoordinated and having trouble tying her shoe.  No headache or vision changes, no focal weakness.  No difficulty with language.  Has not had medical follow-up in several years, takes no medications.        Past Medical History:  Diagnosis Date   Anemia    GERD (gastroesophageal reflux disease)    Hypertension    Severe dysplasia of cervix (CIN III) 10/13/2012   Needs LEEP    Current Outpatient Rx   Order #: [id number] Class: Normal   Order #: [id number] Class: Print   Order #: [id number] Class: Normal   Order #: [id number] Class: Print   Order #: [id number] Class: Print   Order #: [id number] Class: Print   Order #: [id number] Class: Normal   Order #: [id number] Class: Normal   Order #: [id number] Class: Print   Order #: [id number] Class: Normal    Past Surgical History:  Procedure Laterality Date   APPENDECTOMY     CESAREAN SECTION      Physical Exam   Triage Vital Signs: ED Triage Vitals  Encounter Vitals Group     BP 05/15/24 1947 (!) 170/68     Girls Systolic BP Percentile --      Girls Diastolic BP Percentile --      Boys Systolic BP Percentile --      Boys Diastolic BP Percentile --      Pulse Rate 05/15/24 1947 83      Resp 05/15/24 1947 19     Temp 05/15/24 1947 98.4 F (36.9 C)     Temp Source 05/15/24 1947 Oral     SpO2 05/15/24 1947 99 %     Weight --      Height --      Head Circumference --      Peak Flow --      Pain Score 05/15/24 1948 0     Pain Loc --      Pain Education --      Exclude from Growth Chart --     Most recent vital signs: Vitals:   05/15/24 1947  BP: (!) 170/68  Pulse: 83  Resp: 19  Temp: 98.4 F (36.9 C)  SpO2: 99%    General: Awake, no distress.  CV:  Good peripheral perfusion.  Regular rate rhythm, normal distal pulses Resp:  Normal effort.  Clear lungs Abd:  No distention.  Soft nontender Other:  Cranial nerves III through XII intact.  Normal speech and language, normal orientation.  No extremity drift, symmetric sensation.  Normal finger-to-nose, no truncal ataxia.  NIH stroke scale 0   ED Results / Procedures / Treatments   Labs (all labs ordered are listed, but  only abnormal results are displayed) Labs Reviewed  PROTIME-INR - Abnormal; Notable for the following components:      Result Value   Prothrombin Time 16.2 (*)    All other components within normal limits  CBC - Abnormal; Notable for the following components:   WBC 139.9 (*)    RBC 3.30 (*)    Hemoglobin 10.3 (*)    HCT 30.0 (*)    Platelets 34 (*)    nRBC 0.5 (*)    All other components within normal limits  DIFFERENTIAL - Abnormal; Notable for the following components:   Monocytes Absolute 0.0 (*)    All other components within normal limits  COMPREHENSIVE METABOLIC PANEL WITH GFR - Abnormal; Notable for the following components:   Potassium 3.3 (*)    CO2 21 (*)    Glucose, Bld 114 (*)    Creatinine, Ser 1.19 (*)    GFR, Estimated 52 (*)    All other components within normal limits  CBG MONITORING, ED - Abnormal; Notable for the following components:   Glucose-Capillary 114 (*)    All other components within normal limits  APTT  ETHANOL  IMMATURE PLATELET FRACTION   PATHOLOGIST SMEAR REVIEW  COMP PANEL: LEUKEMIA/LYMPHOMA  BCR-ABL1 FISH  LACTATE DEHYDROGENASE  FIBRINOGEN   URIC ACID  PHOSPHORUS  GLUCOSE 6 PHOSPHATE DEHYDROGENASE     EKG    RADIOLOGY CT head interpreted by me, shows extensive edema in the right temporoparietal region.  Discussed with radiology who also notes an area of edema in the left frontal cortex   PROCEDURES:  .Critical Care  Performed by: Viviann Pastor, MD Authorized by: Viviann Pastor, MD   Critical care provider statement:    Critical care time (minutes):  35   Critical care time was exclusive of:  Separately billable procedures and treating other patients   Critical care was necessary to treat or prevent imminent or life-threatening deterioration of the following conditions:  Circulatory failure and CNS failure or compromise   Critical care was time spent personally by me on the following activities:  Development of treatment plan with patient or surrogate, discussions with consultants, evaluation of patient's response to treatment, examination of patient, obtaining history from patient or surrogate, ordering and performing treatments and interventions, ordering and review of laboratory studies, ordering and review of radiographic studies, pulse oximetry, re-evaluation of patient's condition and review of old charts   Care discussed with: accepting provider at another facility      MEDICATIONS ORDERED IN ED: Medications  hydroxyurea  (HYDREA ) capsule 2,000 mg (has no administration in time range)  lactated ringers  infusion (has no administration in time range)  allopurinol  (ZYLOPRIM ) tablet 300 mg (300 mg Oral Given 05/15/24 2246)  sodium chloride  0.9 % bolus 1,000 mL (0 mLs Intravenous Stopped 05/15/24 2252)  ondansetron  (ZOFRAN ) injection 4 mg (4 mg Intravenous Given 05/15/24 2128)  hydroxyurea  (HYDREA ) capsule 2,000 mg (2,000 mg Oral Given 05/15/24 2247)     IMPRESSION / MDM / ASSESSMENT AND PLAN /  ED COURSE  I reviewed the triage vital signs and the nursing notes.  DDx: AKI, electrolyte derangement, anemia, UTI, dehydration, intracranial mass, stroke  Patient's presentation is most consistent with acute presentation with potential threat to life or bodily function.  Patient presents with generalized weakness, neuroexam is nonfocal, presentation not compatible with acute ischemic stroke.  Not consistent with LVO.  Labs reveal leukocytosis of 140,000 with thrombocytopenia   Clinical Course as of 05/15/24 2354  Sun May 15, 2024  2051 CT head discussed with radiology which shows large right temporoparietal infarct, subacute in appearance. [PS]  2131 Current presentation discussed with oncology Dr. Melanee who will need to wait until pathology can review the smear to assess whether this is chronic or acute leukemia which will determine whether patient would be appropriate to stay at [facility] for workup versus being transferred.  Pathology is available to reviewed the smear in the morning. [PS]  2314 Pt seen by Dr. Melanee who reviewed the smear herself and reports findings concerning for acute leukemia. Recommends transfer to tertiary center with capability for induction chemotherapy and plasmapheresis. [PS]  2353 D/w [facility] heme/onc Dr. Jeffie, will callback regarding potential bed availability. 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   Viviann Pastor, MD 05/15/24 2344  "

## 2024-05-15 NOTE — ED Notes (Signed)
 Request tranfer to [facility] waiting on call back

## 2024-05-15 NOTE — Progress Notes (Signed)
 Full note to follow. Briefly- hyperleucocytosis, large area of temporoparietal infarct. No FND on exam. Pt relatively asymptomatic other than fatigue  Blasts in peripheral smear. 2 gm hydrea  given. Started allopurinol , IV hydration.no DIC. Suspicion for APML relatively low.   Patient needs to be transferred to tertiary facility where pheresis is available.  Dr. Annah Skene, MD, MPH [facility] at [facility] [phone] 05/15/2024 11:10 PM

## 2024-05-16 LAB — PATHOLOGIST SMEAR REVIEW

## 2024-05-16 LAB — URIC ACID: Uric Acid, Serum: 6.6 mg/dL (ref 2.5–7.1)

## 2024-05-16 MED ORDER — HYDROXYUREA 500 MG PO CAPS: 2000.0000 mg | ORAL_CAPSULE | Freq: Three times a day (TID) | ORAL | Status: DC

## 2024-05-16 NOTE — H&P (Signed)
 ------------------------------------------------------------------------------- Attestation signed by Rozelle Heller, MD at 05/16/24 1453 I personally saw, examined, and evaluated the patient, participating in the key portions of the service. I have reviewed the fellow's note and agree with the documented findings and plan. The note reflects my participation and input in the interval history, physical exam, and complex medical decision making for a high-risk cancer. This visit included intensive monitoring of high-risk medications, given risk of severe myelosuppression, infection, and other toxicities.  Review included CBC, electrolytes and other blood tests, as well as direct assessment of the patient for symptoms suggesting toxicity.   Carla Weaver is a 63 y.o. with PMH HTN and cervical cancer s/p cone biopsy admitted for leukostasis and concern for new diagnosis of acute leukemia. Today, the patient feels fatigue and appears non-toxic on exam. Discussed concern for acute leukemia diagnosis and need for further workup.    Peripheral smear review and notable for 97% blasts, awaiting flow for final confirmation Leukostasis with CNS symptoms - on hydrea  2g and will give dose of Cytoxan for emergent WBC reduction today as staffing is not sufficient to allow for leukapheresis ?CVA - discussed with neurology who feel that CT findings more likely 2/2 to ceberal edema/leukostasis and not CVA, will obtain MRI brain and complete stroke workup. Pancytopenia and immunosuppression secondary to disease. Holding transfusions due to leukostasis  6.   Antimicrobial prophylaxis per protocol. 7.  HTN: Allowing for permissive HTN for now I reviewed the following with the patient: - anticipated hospital stay - plan for cytoreduction with hydroxyurea , while monitoring for complications such as tumor lysis syndrome - plan for bone marrow biopsy for further evaluation - plan to await additional pending diagnostic  information which will inform chemotherapy recommendations - the chemotherapy plan including where we are currently and the anticipated stay  Heller Rozelle, MD PhD  -------------------------------------------------------------------------------  Hematology/Oncology   Attending Physician :  [name], MD Accepting Service  : Oncology/Hematology (MDE) Reason for Admission: concern for acute leukemia  Problem List: Problem List[1]   Assessment/Plan: Carla Weaver is a 63 y.o. female who was admitted for leukocytosis and concern for acute leukemia, also found to have acute/subacute infarcts on head imaging.  Leukocytosis, concern for acute leukemia: Unclear acuity of leukocytosis with thrombocytopenia and anemia, with differential including acute or chronic leukemia. Patient reports short duration of malaise only for a few days.  - Strict I&O - Continue hydroxyurea  2g q8hrs - Baseline CBC, CMP, TLS, DIC labs - Follow up baseline labs to determine frequency of labs and need for IVF - Continue Allopurinol  300mg  daily - Hematopathology leukemia/lymphoma flow cytometry on peripheral blood, pathology smear review - HBsAb, HBsAg, HBcAb, HCV Ab, HIV - G6PD level - Bone marrow biopsy in AM   Acute vs subacute CVA: CT head at OSH 05/15/24 reports large R temporoparietal acute or subacute infarct, possible L frontal acute or subacute infarct. On presentation, 4+/5 strength in L upper extremity. Unclear if CVA related to leukocytosis (uncertain blast percentage on presentation) vs alternate etiology.  - Cytoreduction as above with hydroxyurea  - Stroke workup including lipid panel, hemoglobin A1C, TTE with bubble study, telemetry - Neurology consult in AM - Will follow up labs/smear review to assess potential utility of leukopheresis  Cancer-related fatigue: Patient endorses fatigue over the last few days.  Primary symptoms are generalized weakness and lethargy.  Immunocompromised  status: Patient is immunocompromised secondary to underlying disease. - Antimicrobial prophylaxis as above   Impending electrolyte abnormality: Secondary to chemotherapy and/or IV  fluid resuscitation. - Daily electrolyte monitoring - [facility] guidelines  HTN: Reports previously on atenolol  but has not taken anything in years. - Monitor BP  Pancytopenias:  - Transfuse 1 unit of pRBCs for hgb < 6 (lower threshold for now while considering possible leukostasis) - Transfuse 1 unit plt for plts < 10k, <20k if febrile  Cervical Cancer (in remission): Reports previously diagnosed with cervical cancer 10 years ago; underwent cone biopsy and did not need major surgery, chemotherapy, or radiation.  HPI: Carla Weaver is a 63 y.o. female with a history of HTN, early stage cervical cancer s/p cone biopsy who presented with malaise and fatigue to outside hospital.   She reports that she was in [place] until Friday when she got sick after eating at a food truck. She reports poor appetite since then, an episode of nausea, but no diarrhea or fever. She also reports dropping things easily yesterday but does not feel one arm was weaker than the other. She reports falling yesterday morning as well but did not hit her head - states she lost her balance. She reports longstanding easy bruising and a history of sinus infections (once a year or so) but says these aren't very frequent and resolve quickly.  She does not follow regularly with a primary care doctor - thinks she last had labs done over 3 years ago (unsure of when), but thinks they were normal. Last labs available in the system in 2014 showed WBC 11.6 (no differential), Hgb 11.5, Plt 262. CT head done at OSH for generalized weakness showed R temporoparietal infarct and possible L sided infarct.   At [facility], labs showed WBC 139.9, Hgb 10.3, Plt 34. BMP shows K 3.3, Cr 1.19.   Review of systems: All positive and pertinent  negatives are noted in the HPI; otherwise all other systems are negative.  Oncologic history:  Leukocytosis - workup pending Cervical cancer in remission - treated with cone biopsy  Primary oncologist: TBD Hematology/Oncology History   No problem history exists.    Medical History: PCP: Avelina Greig BRAVO, MD Past Medical History[2] Surgical History: Past Surgical History[3]  Social History: Social History [4]  Family History:  family history includes Colon cancer in her paternal grandmother; Kidney failure in her father; Ovarian cancer in her maternal grandmother; Pancreatic cancer in her sister.   Allergies: is allergic to sulfa (sulfonamide antibiotics), ampicillin, and prednisone .  Medications:  Meds:Scheduled Medications[5] Continuous Infusions:Infusions Meds[6] PRN Meds:.PRN Medications[7]  Objective:  Vitals: Temp:  [36.6 C (97.9 F)] 36.6 C (97.9 F) Pulse:  [79] 79 SpO2 Pulse:  [80] 80 Resp:  [24] 24 BP: (165)/(54) 165/54 MAP (mmHg):  [91] 91 SpO2:  [99 %] 99 %  Physical Exam: General: Resting, in no apparent distress, lying in bed HEENT:  PERRL. No scleral icterus or conjunctival injection. MMM without ulceration, erythema or exudate. No cervical or axillary lymphadenopathy.  Heart:  RRR. S1, S2. No murmurs, gallops, or rubs. Lungs:  Breathing is unlabored, and patient is speaking full sentences with ease.  No stridor.  CTAB. No rales, ronchi, or crackles.   Abdomen:  No distention or pain on palpation.  Bowel sounds are present and normoactive x 4.  No palpable hepatomegaly or splenomegaly.  No palpable masses. Skin:  No rashes, petechiae or purpura.  No areas of skin breakdown. Warm to touch, dry, smooth, and even. Musculoskeletal:  No grossly-evident joint effusions or deformities.  Range of motion about the shoulder, elbow, hips and knees  is grossly normal. Psychiatric:  Range of affect is appropriate.   Neurologic:  Alert and oriented to person, place, time  and situation.  Gait is normal.  No nystagmus. Cerebellar tasks (finger-to-nose, rapid hand movement, heel along shin) are completed with ease and are symmetric. CNII-CNXII grossly intact. 4+/5 strength in LUE, 5/5 in RUE. 5/5 strength in b/l lower extremities. Extremities:  Appear well-perfused. No clubbing, edema, or cyanosis. CVAD: R CW Port - no erythema, nontender; dressing CDI.  Test Results:  No results for input(s): WBC, NEUTROABS, HGB, PLT in the last 72 hours. No results for input(s): NA, K, CL, CO2, BUN, CREATININE, MG, PHOS, CALCIUM in the last 72 hours.  Imaging: Radiology studies were personally reviewed  CT head at outside hospital: IMPRESSION:  1. 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  2. Possible additional  left frontal acute or subacute infarct.  3. MRI is recommended for further evaluation .  4. Critical value/emergent results were called by telephone at the time of  interpretation on 05/15/24 @ 8:50 pm to Dr. Viviann, who verbally acknowledged  these results.   DVT PPX Indicated: yes, intermittent pneumatic compression boots  Need for PT: yes Anticipated Discharge: Their home  Code Status: Full Code  I personally spent 60 minutes face-to-face and non-face-to-face in the care of this patient, which includes all pre, intra, and post visit time on the date of service.  All documented time was specific to the E/M visit and does not include any procedures that may have been performed.        [1] Patient Active Problem List Diagnosis   Hypertension   Leukocytosis   CVA (cerebral vascular accident)    (CMS-HCC)   Tobacco use  [2] No past medical history on file. [3] Past Surgical History: Procedure Laterality Date   APPENDECTOMY     CESAREAN SECTION    [4] Social History Socioeconomic History   Marital status: Married  Tobacco Use   Smoking status: Every Day    Current packs/day: 0.50    Average  packs/day: 0.5 packs/day for 46.0 years (23.0 ttl pk-yrs)    Types: Cigarettes    Start date: 05/26/1978  Substance and Sexual Activity   Alcohol use: No   Social Drivers of Health   Tobacco Use: High Risk (05/16/2024)   Patient History    Smoking Tobacco Use: Every Day    Smokeless Tobacco Use: Unknown  [5]  allopurinol   300 mg Oral Daily   hydroxyurea   2,000 mg Oral Q8H   sodium chloride   10 mL Intravenous BID   sodium chloride   10 mL Intravenous BID   sodium chloride   10 mL Intravenous BID  [6]  sodium chloride     [7] emollient combination no.92, loperamide, loperamide

## 2024-05-16 NOTE — Unmapped External Note (Signed)
 RAPID RESPONSE TEAM NOTE     A time out completed at the beginning of the rapid response identifying Primary RN, ARRT RN, Primary Provider, RT and reason why ARRT activated.    At the time of the rapid response, patient's allergies and MAR reviewed per Epic.  Code Status at onset of Rapid Response: Full  Location of Rapid Response: [facility]  Attending present: No  The patients primary language is English. Interpreter services were N/A  Deterioration Index Score at onset of Rapid Response: 21-30   Situation: Rapid Response was activated for altered mental status and worried staff/family    The following additional teams were utilized: N/A   Assessment:  Upon arrival to RR, pt was extremely agitated, VSS. Pt had just received steroids as a premedication and believes that is what is causing her restlessness. Primary provider at bedside, talking to patient about need for head CT and ativan, however, pt heavily refusing both. Pt is believed to have capacity for decision making and RR can not force her to go to scan if she refuses. RR RN will be available if pt decides she would be willing to go for a stat head CT.    Interventions: provider assessment / conversation   Pending interventions: stat head CT  Debriefing was performed by Rapid Response team members with agreed upon plan of care among key stakeholders.   Outcome:  No transfer     If patient resides in non-ICU setting, education was provided that if patient exhibits any further acute?deviations from baseline ?and/or have signs and symptoms of patient deterioration, the recommendation is to activate the rapid response team. See flow sheet for further synopsis of patient care.

## 2024-05-16 NOTE — Consults (Signed)
 Speech Language Pathology Clinical Swallow Assessment Evaluation (05/16/24 1150)  Patient Name:  Carla Weaver      Medical Record Number: [id number]  Date of Birth: [birth year] Sex: Female          SLP Treatment Diagnosis:  r/o oropharyngeal dysphagia, r/o cognitive-linguistic impairment Activity Tolerance: Patient tolerated treatment well  Assessment Unremarkable screen of speech, swallow, language, and cognition in the setting of incidental finding of acute/subacute infarcts on head imaging. Awake, alert, attentive, oriented, good historian. Tolerating po regular consistency solids and thin liquids without clinical s/sx of aspiration, baseline chronic difficulty with pills throughout life, compensating well. No further ST needs.  Recommendations:       Diet Liquids Recommendations: Thin Liquids, Level 0, No Restrictions   Diet Solids Recommendation: Regular Consistency Solids   Recommended Form of Medications: With liquid, Crushed    Post Acute Discharge Recommendations Post Acute SLP Discharge Recommendations: Skilled SLP services are NOT indicated  Prognosis: Good Positive Indicators: + performance today  Plan of Care SLP Daily Frequency: D/C Services, D/C Services  Treatment Goals: Patient and Family Goal: to go home  Subjective Medical Updates Since Last Visit/Relevant PMH Affecting Clinical Decision Making: Carla Weaver is a 63 y.o. female who was admitted for leukocytosis and concern for acute leukemia, after presented with malaise and fatigue to outside hospital, also found to have acute/subacute infarcts on head imaging. Unclear acuity of leukocytosis with thrombocytopenia and anemia, with differential including acute or chronic leukemia. Patient reports short duration of malaise only for a few days. 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 On presentation, 4+/5 strength in L upper  extremity. Unclear if CVA related to leukocytosis (uncertain blast percentage on presentation) vs alternate etiology. Seen today for screen of speech, swallow, language, and cognition. Awake, alert, cooperative. Sitting up on edge of bed, family at bedside. Prior Function: Independent prior to admission     Communication Preference: Verbal Patient/Caregiver Reports: pt and family deny any changes in speech, swallow, language, or cognition; pt reports she has never been able to swallow pills, has always dissolved them in hot water and taken them that way Pain: no s/s of pain    Allergies: Sulfa (sulfonamide antibiotics), Ampicillin, and Prednisone  Current Medications[1] Past Medical History[2] Family History[3] Past Surgical History[4] Social History   Tobacco Use   Smoking status: Every Day    Current packs/day: 0.50    Average packs/day: 0.5 packs/day for 46.0 years (23.0 ttl pk-yrs)    Types: Cigarettes    Start date: 05/26/1978   Smokeless tobacco: Not on file  Substance Use Topics   Alcohol use: No     General:              Self-Feeding Capacity: Functional for self-feeding                 Medical Tests / Procedures Comments: CXR 12/21: No focal opacity or pleural effusion. CT Head 12/21: Cytotoxic edema in right temporoparietal region extending   into the posterior right frontal lobe with mild mass effect but no midline   shift. Ill-defined low-density in left frontal lobe concerning for subacute   infarct. Patchy periventricular and subcortical white matter low-density changes compatible with chronic microvascular ischemic change.  Precautions / Restrictions Precautions: Aspiration precautions, Falls precautions, Bleeding Precautions  Objective Respiratory Status : Room air History of Intubation: No  Behavior/Cognition: Alert, Cooperative, Pleasant mood Positioning : Upright in bed  Oral / Motor Exam Vocal Quality: Normal Volitional Swallow: Within Functional  Limits  Labial ROM: Within Functional Limits  Labial Symmetry: Within Functional Limits Labial Strength: Within Functional Limits  Lingual ROM: Within Functional Limits Lingual Symmetry: Within Functional Limits Lingual Strength: Within Functional Limits    Velum: elevation grossly WNL  Mandible: Within Functional Limits Coordination: intact Facial ROM: Within Functional Limits  Facial Symmetry: Within Functional Limits Facial Strength: Within Functional Limits    Vocal Intensity: Within Functional Limits     Apraxia: None present  Dysarthria: None present  Intelligibility: Intelligible  Breath Support: Adequate for speech  Dentition: Edentulous  Consistencies assessed: thin liquids  Patient at end of session: All needs in reach, Friends/Family present, In bed, Lines intact  Speech Therapy Session Duration SLP Individual [mins]: 15  I attest that I have reviewed the above information. Signed: Almarie JONELLE Gilbert, CCC-SLP  Filed 05/16/2024       [1] Current Facility-Administered Medications  Medication Dose Route Frequency Provider Last Rate Last Admin   allopurinol  (ZYLOPRIM ) oral suspension  300 mg Oral Daily Tuck, Ellen Jordan, PA   300 mg at 05/16/24 1124   emollient combination no.92 (LUBRIDERM) lotion 1 Application  1 Application Topical Q1H PRN Bhatta, Manasa R, MD       hydroxyurea  (XROMI ) oral solution 2,000 mg  2,000 mg Oral Q4H Tuck, Ellen Jordan, PA   2,000 mg at 05/16/24 1124   levoFLOXacin (LEVAQUIN) oral solution  500 mg Oral daily Moses-Lebron, Alyse Jenna, PA       loperamide (IMODIUM) capsule 2 mg  2 mg Oral Q2H PRN Bhatta, Manasa R, MD       loperamide (IMODIUM) capsule 4 mg  4 mg Oral Once PRN Bhatta, Manasa R, MD       polyethylene glycol (MIRALAX) packet 17 g  17 g Oral Daily Tuck, Ellen Jordan, [place]       sodium chloride  (NS) 0.9 % flush 10 mL  10 mL Intravenous BID Bhatta, Manasa R, MD       sodium chloride  (NS) 0.9 % flush 10 mL  10 mL  Intravenous BID Bhatta, Manasa R, MD       sodium chloride  (NS) 0.9 % flush 10 mL  10 mL Intravenous BID Bhatta, Manasa R, MD   10 mL at 05/16/24 0831   sodium chloride  (NS) 0.9 % infusion  150 mL/hr Intravenous Continuous Tuck, Ellen Jordan, PA 150 mL/hr at 05/16/24 0828 150 mL/hr at 05/16/24 9171   valACYclovir (VALTREX) oral suspension  500 mg Oral Daily Moses-Lebron, Alyse Jenna, PA   500 mg at 05/16/24 1124  [2] No past medical history on file. [3] Family History Problem Relation Age of Onset   Kidney failure Father    Pancreatic cancer Sister    Ovarian cancer Maternal Grandmother    Colon cancer Paternal Grandmother   [4] Past Surgical History: Procedure Laterality Date   APPENDECTOMY     CESAREAN SECTION

## 2024-05-16 NOTE — Consult Note (Signed)
 "  Hematology/Oncology Consult note [facility] [phone] [phone]  Patient Care Team: Pcp, No as PCP - General   Name of the patient: Carla Weaver  [id number]  [birth year]    Reason for consult: Hyperleukocytosis   Requesting physician: Dr. Viviann  Date of visit: 05/15/24   History of presenting illness-patient is a 63 year old female with a past medical history significant for hypertension who presented to the hospital with symptoms of significant fatigue.  She has been noncompliant with her antihypertensive medications.  On admission patient was noted to have a white cell count of 139, H&H of 10.3/30 and a platelet count of 34.  PT PTT INR were within normal limits.  CMP was within normal limits except for mild hypokalemia with a potassium level of 3.3.  Patient had a CT head without contrast which showed large right temporoparietal acute or subacute infarct.  Possible left frontal acute or subacute infarct.  At the time of my visit patient reports feeling better with IV fluids and other than fatigue she denies other complaints  ECOG PS- 1  Pain scale- 0   Review of systems- Review of Systems  Constitutional:  Positive for malaise/fatigue. Negative for chills, fever and weight loss.  HENT:  Negative for congestion, ear discharge and nosebleeds.   Eyes:  Negative for blurred vision.  Respiratory:  Negative for cough, hemoptysis, sputum production, shortness of breath and wheezing.   Cardiovascular:  Negative for chest pain, palpitations, orthopnea and claudication.  Gastrointestinal:  Negative for abdominal pain, blood in stool, constipation, diarrhea, heartburn, melena, nausea and vomiting.  Genitourinary:  Negative for dysuria, flank pain, frequency, hematuria and urgency.  Musculoskeletal:  Negative for back pain, joint pain and myalgias.  Skin:  Negative for rash.  Neurological:  Negative for dizziness, tingling, focal  weakness, seizures, weakness and headaches.  Endo/Heme/Allergies:  Does not bruise/bleed easily.  Psychiatric/Behavioral:  Negative for depression and suicidal ideas. The patient does not have insomnia.     Allergies[1]  Patient Active Problem List   Diagnosis Date Noted    HX of Severe dysplasia of cervix (CIN III) 10/13/2012   HTN (hypertension), benign 07/06/2012   Chronic insomnia 07/06/2012     Past Medical History:  Diagnosis Date   Anemia    GERD (gastroesophageal reflux disease)    Hypertension    Severe dysplasia of cervix (CIN III) 10/13/2012   Needs LEEP     Past Surgical History:  Procedure Laterality Date   APPENDECTOMY     CESAREAN SECTION      Social History   Socioeconomic History   Marital status: Married    Spouse name: Not on file   Number of children: Not on file   Years of education: Not on file   Highest education level: Not on file  Occupational History   Not on file  Tobacco Use   Smoking status: Every Day    Current packs/day: 0.00    Average packs/day: 1 pack/day for 34.0 years (34.0 ttl pk-yrs)    Types: Cigarettes    Start date: 09/11/1981    Last attempt to quit: 09/12/2015    Years since quitting: 8.6   Smokeless tobacco: Never   Tobacco comments:    stop 8 months ago  Substance and Sexual Activity   Alcohol use: No    Alcohol/week: 0.0 standard drinks of alcohol   Drug use: No   Sexual activity: Not on file  Other Topics Concern   Not on  file  Social History Narrative   Work in greenhouses.    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   Exercise: At work.     Diet: decreased portion.    Increased water.         Social Drivers of Health   Tobacco Use: High Risk (05/15/2024)   Patient History    Smoking Tobacco Use: Every Day    Smokeless Tobacco Use: Never    Passive Exposure: Not on file  Financial Resource Strain: Not on file  Food Insecurity: Not on file  Transportation Needs: Not on file  Physical Activity: Not on file   Stress: Not on file  Social Connections: Not on file  Intimate Partner Violence: Not on file  Depression (EYV7-0): Not on file  Alcohol Screen: Not on file  Housing: Not on file  Utilities: Not on file  Health Literacy: Not on file     Family History  Problem Relation Age of Onset   Hypertension Mother    Arthritis Mother    Hypertension Father    Vision loss Father        galucoma   Hypertension Sister    Alcohol abuse Brother    Cancer Maternal Aunt 39       breast cancer   Colon cancer Neg Hx    Pancreatic cancer Neg Hx    Stomach cancer Neg Hx     Current Medications[2]   Physical exam:  Vitals:   05/16/24 0114 05/16/24 0429 05/16/24 0444 05/16/24 0509  BP: (!) 135/58 (!) 142/60 (!) 142/60 (!) 142/60  Pulse: 73 74 74 74  Resp: 16 18  18   Temp:  98.4 F (36.9 C) 98.4 F (36.9 C) 98.4 F (36.9 C)  TempSrc:  Oral  Oral  SpO2: 100% 100% 100% 100%   Physical Exam Cardiovascular:     Rate and Rhythm: Normal rate and regular rhythm.     Heart sounds: Normal heart sounds.  Pulmonary:     Effort: Pulmonary effort is normal.     Breath sounds: Normal breath sounds.  Abdominal:     General: Bowel sounds are normal.     Palpations: Abdomen is soft.  Skin:    General: Skin is warm and dry.  Neurological:     Mental Status: She is alert and oriented to person, place, and time.     Motor: No weakness.           Latest Ref Rng & Units 05/15/2024    7:50 PM  CMP  Glucose 70 - 99 mg/dL 885   BUN 6 - 20 mg/dL 19   Creatinine 9.55 - 1.00 mg/dL 8.80   Sodium 864 - 854 mmol/L 136   Potassium 3.5 - 5.1 mmol/L 3.3   Chloride 98 - 111 mmol/L 99   CO2 22 - 32 mmol/L 21   Calcium 8.9 - 10.3 mg/dL 9.4   Total Protein 6.5 - 8.1 g/dL 7.5   Total Bilirubin 0.0 - 1.2 mg/dL 0.4   Alkaline Phos 38 - 126 U/L 92   AST 15 - 41 U/L 26   ALT 0 - 44 U/L 22       Latest Ref Rng & Units 05/15/2024    7:50 PM  CBC  WBC 4.0 - 10.5 K/uL 139.9   Hemoglobin 12.0 - 15.0  g/dL 89.6   Hematocrit 63.9 - 46.0 % 30.0   Platelets 150 - 400 K/uL 34     @IMAGES @  DG Chest Portable 1  View Result Date: 05/15/2024 CLINICAL DATA:  Fatigue leukocytosis EXAM: PORTABLE CHEST 1 VIEW COMPARISON:  06/07/2012 FINDINGS: No focal opacity or pleural effusion. Normal cardiac size with aortic atherosclerosis. IMPRESSION: No active disease. Electronically Signed   By: Luke Bun M.D.   On: 05/15/2024 22:19   CT HEAD WO CONTRAST Result Date: 05/15/2024 EXAM: CT HEAD WITHOUT CONTRAST 05/15/2024 08:36:44 PM TECHNIQUE: CT of the head was performed without the administration of intravenous contrast. Automated exposure control, iterative reconstruction, and/or weight based adjustment of the mA/kV was utilized to reduce the radiation dose to as low as reasonably achievable. COMPARISON: None available. CLINICAL HISTORY: Neuro deficit, acute, stroke suspected; LKN 12/20 9 pm FINDINGS: BRAIN AND VENTRICLES: No acute hemorrhage. Cytotoxic edema in right temporoparietal region extending into the posterior right frontal lobe with mild mass effect but no midline shift. Ill-defined low-density in left frontal lobe concerning for subacute infarct. Patchy periventricular and subcortical white matter low-density changes compatible with chronic microvascular ischemic change. No hydrocephalus. No extra-axial collection. ORBITS: No acute abnormality. SINUSES: No acute abnormality. SOFT TISSUES AND SKULL: No acute soft tissue abnormality. No skull fracture. Atherosclerotic calcifications in skull base large vessels. IMPRESSION: 1. 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 2. Possible additional  left frontal acute or subacute infarct. 3. MRI is recommended for further evaluation . 4. Critical value/emergent results were called by telephone at the time of interpretation on 05/15/24 @ 8:50 pm to Dr. Viviann, who verbally acknowledged these results. Electronically signed by: Norman Gatlin MD  05/15/2024 08:52 PM EST RP Workstation: [id number]    Assessment and plan- Patient is a 63 y.o. female admitted for fatigue found to have hyperleukocytosis  I have personally reviewed patient's smear under the microscope and I am concerned that there are large atypical looking cells that are concerning for blasts.  High nuclear to cytoplasmic ratio and scalloping of the edges.  Given the presence of hyperleukocytosis as well as temporoparietal infarct noted in the CT scan this is concerning for possible leukostasis because of hyperleukocytosis.  This is typically seen in the setting of acute leukemia.  I have also discussed this case with pathologist on-call Dr. Rebbecca and I did send him the pictures of the smear.  His impression was also concern for blasts.  I would like the patient to be transferred to a tertiary facility where there are facilities for pheresis available.  I have asked the ER to give her 2 g of Hydrea .  Start allopurinol  300 mg daily and continue with IV hydration.  I have asked ER to get a chest x-ray.  PT PTT INR is normal and there is no overt evidence of DIC.  Concern for APML is low.  Patient will need expedited workup including bone marrow biopsy and treatment for acute leukemia at tertiary facility.    Total face to face encounter time for this patient visit was 30 min.  Total non face to face encounter time for this patient on the day of the visit was 30 min. Time spent in discussing patients case with 10 min     Visit Diagnosis 1. Generalized weakness   2. 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 Annah Skene, MD, MPH [facility] [phone] 05/16/2024                   [1]  Allergies Allergen Reactions   Sulfa Antibiotics    Ampicillin Rash  Prednisone  Anxiety  [2]  Current Facility-Administered Medications:    allopurinol  (ZYLOPRIM ) tablet 300 mg, 300 mg, Oral, Daily,  Viviann Pastor, MD, 300 mg at 05/15/24 2246   hydroxyurea  (HYDREA ) capsule 2,000 mg, 2,000 mg, Oral, Q8H, Viviann Pastor, MD   lactated ringers  infusion, , Intravenous, Continuous, Viviann Pastor, MD, Last Rate: 250 mL/hr at 05/15/24 2358, New Bag at 05/15/24 2358  Current Outpatient Medications:    atenolol  (TENORMIN ) 50 MG tablet, Take 0.5 tablets (25 mg total) by mouth daily., Disp: 15 tablet, Rfl: 0   benzonatate  (TESSALON  PERLES) 100 MG capsule, Take 1 capsule (100 mg total) by mouth 3 (three) times daily as needed for cough (Take 1-2 per dose)., Disp: 30 capsule, Rfl: 0   diclofenac  (VOLTAREN ) 75 MG EC tablet, Take 1 tablet (75 mg total) by mouth 2 (two) times daily., Disp: 60 tablet, Rfl: 2   fluticasone  (FLONASE ) 50 MCG/ACT nasal spray, Place 2 sprays into both nostrils daily., Disp: 16 g, Rfl: 0   HYDROmorphone  (DILAUDID ) 2 MG tablet, Take 1 tablet (2 mg total) by mouth every 12 (twelve) hours as needed for severe pain., Disp: 20 tablet, Rfl: 0   naproxen  (NAPROSYN ) 500 MG tablet, Take 1 tablet (500 mg total) by mouth 2 (two) times daily with a meal., Disp: 20 tablet, Rfl: 00   nortriptyline  (PAMELOR ) 50 MG capsule, Take 1 capsule (50 mg total) by mouth at bedtime., Disp: 30 capsule, Rfl: 0   nortriptyline  (PAMELOR ) 75 MG capsule, Take 1 capsule (75 mg total) by mouth at bedtime., Disp: 30 capsule, Rfl: 5   oxyCODONE -acetaminophen  (PERCOCET) 7.5-325 MG tablet, Take 1 tablet by mouth every 6 (six) hours as needed for severe pain., Disp: 12 tablet, Rfl: 0   traMADol  (ULTRAM ) 50 MG tablet, Take 1 tablet (50 mg total) by mouth every 6 (six) hours as needed for moderate pain., Disp: 12 tablet, Rfl: 0  "

## 2024-05-16 NOTE — Telephone Encounter (Signed)
 Transfer Center Request Note  Date and Time: May 16, 2024 12:10 AM  Requesting Physician: Dr. Viviann  Requesting [facility]: [facility]  Requesting Service: Med Q  Reason for Transfer: acute leukemia  Patient been to [facility] before? yes  Brief Hospital Course:  43F with PMH of hypertension not on medication who presented to OSH with 3 days of generalized malaise and 1 day of generalized weakness. Per the patient, no focal neurologic symptoms, including headache, vision/hearing changes, focal weakness in any extremity, confusion, ataxia, or other altered sensorium. She also denies any dyspnea or other difficulty breathing.  Labs on admission notable for the following: - CBC: WBC 140k, Hgb 10.3, PLT 34k - CMP: K 3.3, Cr 1.2 - uric acid: pending - LDH: 422 - aPTT 26, INR 1.2 - Peripheral smear: blasts present (unknown percentage)  Vitals on admission are the following: - T 98.4 - BP 170/68 - P 83 - RR 19 - SpO2 99% RA  Imaging on admission notable for the following: - CTH (done as screening due to generalized weakness, even without focal deficits) showed subacute R temporal/parietal infarct  After discussion with attending, Dr. Claudean, we have accepted the patient for transfer. Patient will be admitted to stepdown bed given bed availability, and due to the [facility] findings at OSH.   Plan Upon Arrival:  - Strict I&O - Telemetry  - To lower counts please continue one of the following:  - Continue hydroxyurea  2g q8hrs - Hydroxyurea  max daily = 12g, max dose = 2g, max dosing = 2g q4hr; (most common dose dependent SE = GI, mucositis, rash, skin ulcers) - Can take up to 24-48hr to work and dose typically adjusted every 8-12hrs - If febrile: start cefepime, with vancomycin if c/f soft tissue infection  - Continue IV normal saline 150-250cc/hr as tolerated for TLS and to prevent leukostasis  - Continue Allopurinol  300mg  daily - If severe renal impairment  present dose reduce by 50% - Obtain baseline EKG, TTE, and CXR - Initial Labs  - Hematopathology leukemia/lymphoma flow cytometry on peripheral blood - HBsAb, HBsAg, HBcAb, HCV Ab, HIV, HSV - G6PD level (note, acquired G6PD deficiency can be seen in untreated hyperaldosteronism and poorly controlled diabetes) - Scheduled Labs - CBC w/diff q6hr - TLS labs q6hr (Renal function panel (BMP + Phos + Albumin), LDH, Uric Acid, Mg) - Start non-calcium phosphate binders (sevelamer) if serum phosphorus is >= 6 mg/dL - Avoid calcium supplementation unless symptomatic from hypocalcemia  - DIC panel q6hr (PT/INR, aPTT, Fibrinogen , D-dimer) - Type and Screen q72hr - Transfusion goals - Hgb <6g/dL- page malig heme prior to transfusion as we will allow for lower hgb if there is a very elevated WBC count due to c/f hyperviscosity  - Platelets <10k (<50k if APL/DIC/active bleed; <20k if febrile)  - Fibrinogen  <150, transfuse 1 unit cryoprecipitate  - Notify fellow/APPs of possible need for bone marrow biopsy in the AM  Bed Type: Stepdown  Accepting Service: Med Q  Checklist for admission to [facility] from outside ED: SHOULD MEET THE FOLLOWING VITAL PARAMATERS: HR >60 and <120 yes SBP >90 and <180 yes Respirations >8 and <25 yes O2 sat >90% on < or = to 40% or stable home O2 requirement yes  SHOULD NOT HAVE ANY OF THE FOLLOWING (SHOULD BE ALL BE NO ANSWERS): Concern for neurologic complications related to cancer or treatments yes Acute AMS no Positive troponin no Concern for leukostasis no Severe sepsis/hemodynamic instability no Differential includes sepsis or leukostasis  no Supportive nursing interventions at least every 2 hours no Drips, titratable meds or meds requiring frequent monitoring no Vitals/Assessments/Labs more than every 4 hours no Undiagnosed malignancy no Unclear which primary service would best serve the patient no  IF ANY OF THE ANSWERS ABOVE ARE ABNORMAL: Patient is clinically  appropriate for the floor despite the above (with rationale)? no - patient is being admitted to stepdown Discussed with [facility] nursing? no  Records of consults, imaging, pathology and discharge summary request to be faxed to [facility] fax [phone] : no  Disk of imaging requested: no  Pathology slides requested:no  Please page Med E Fellow at [phone] when patient arrives.   Fellow Triaging Request: Naveen Subramanian, MD

## 2024-05-16 NOTE — Consults (Signed)
 "   Initial Consult Note     Requesting Attending Physician:  [name], MD Service Requesting Consult: Oncology/Hematology (MDE)   Assessment and Plan       Carla Weaver is a 63 y.o. right handed female on whom I have been asked by [name], MD to consult for abnormal neuroimaging.  #Cytotoxic edema in right temporoparietal region extending into the posterior right frontal lobe with mild mass effect   63 year old woman with a history of hypertension who was transferred from an outside hospital for neurologic evaluation after presenting with malaise, fatigue, and abnormal neuroimaging.  The patient reports being in her usual state of health until Friday, when she began feeling unwell after eating at a food truck. Since that time, she has experienced poor appetite and one episode of nausea, without vomiting, diarrhea, or fever. On the day prior to presentation, she noted increased clumsiness, describing difficulty holding objects and dropping items easily, though she did not perceive focal weakness or lateralization of symptoms.  She reports a longstanding history of easy bruising and intermittent sinus infections occurring approximately once per year, which she states are infrequent and self-limited. She does not follow regularly with a primary care provider and believes her last laboratory evaluation was over three years ago; she recalls being told prior labs were normal.  At the outside hospital, a non-contrast CT head obtained for evaluation of generalized weakness demonstrated a cytotoxic edema in the right temporoparietal region extending into the posterior right frontal lobe with mild mass effect. Given the appearance and distribution of these findings, there is concern that the lesions may represent mass lesions rather than ischemic infarcts, raising the possibility of metastatic disease in the setting of prior malignancy. In light of these findings, MRI of the  brain with and without contrast has been recommended to further characterize the lesions and distinguish between ischemic, neoplastic, or other etiologies.  Plan: - Brain MRI w/wo contrast - Continue decadron per primary team  This patient was seen and discussed with Dr. Pervis, who agrees with the above assessment and plan.    Marjorie Rolanda Couch PGY-2 Neurology Resident  I have personally seen the patient with the resident physician responsible for the patient encounter. We have discussed the history, physical exam and formulated an assessment plan together. I have reviewed the note for accuracy and appropriate coding.   Jorge L. Almodovar Suarez, MD Clinical Associate Professor of Neurology [facility]      HPI     Reason for Consult: Abnormal head CT findings  Carla Weaver is a 63 y.o. right handed female on whom I have been asked by [name], MD to consult for abnormal neuroimaging.  Carla Weaver is a 63 year old woman with a history of hypertension and early-stage cervical cancer status post cone biopsy who presented to an outside hospital with malaise and fatigue. She reports being in her usual state of health until Friday, when she began feeling unwell after eating at a food truck. Since that time, she has had poor appetite and one episode of nausea, without vomiting, diarrhea, or fever.  Over the day prior to presentation, she noted new clumsiness, describing difficulty holding objects and dropping items easily, though she denies focal or lateralized weakness. She also reports a fall due to loss of balance without head strike or loss of consciousness. She denies speech changes, facial droop, visual symptoms, or sensory loss.  She reports a longstanding history of easy bruising and intermittent sinus infections  occurring approximately once per year, which are infrequent and self-limited. She does not follow regularly with a primary care provider and believes her  last laboratory evaluation was over three years ago, though she is unsure of the exact timing.   At the outside hospital, a non-contrast CT head obtained for evaluation of generalized weakness demonstrated a right temporoparietal lesion prompting transfer for further evaluation.  Allergies[1]   Past Medical History[2]  Past Surgical History[3]  Social History[4]  Family History[5]  Code Status: Full Code   Review of Systems   A 10-system review of systems was conducted and was negative except as documented above in the HPI.     Objective      Temp:  [36.6 C (97.9 F)] 36.6 C (97.9 F) Pulse:  [79-100] 100 SpO2 Pulse:  [80-97] 97 Resp:  [24-25] 25 BP: (146-165)/(54-91) 146/91 MAP (mmHg):  [91-108] 108 SpO2:  [97 %-99 %] 97 % No intake/output data recorded.  Physical Exam: General Exam: General Appearance: Well appearing. In no acute distress. HEENT: Head is atraumatic and normocephalic. Sclera anicteric without injection. Oropharyngeal membranes are moist with no erythema or exudate. Neck: Supple. Lungs: Normal work of breathing. Heart: Regular rate and rhythm. Abdomen: Soft, nontender, nondistended. Extremities: No clubbing, cyanosis, or edema.  Neurological Exam: Mental Status: alert, oriented to person, place and time. GCS 15. Attention and concentration are intact. The patient follows all commands with no difficulty. Speech Fluent without paraphasic errors and no  dysarthria . Names and repeats with no  difficulty. Recent and remote memory appear intact.   Cranial Nerves: Visual fields intact to direct confrontation.  PERRL. Ocular movements intact in all directions. no nystagmus. Facial sensation intact bilaterally to light touch in all three divisions of CNV. Face symmetric at rest. Normal facial movement bilaterally, including forehead, eye closure and grimace/smile. Hearing intact to conversation. Palate movement is symmetric. Tongue protrudes midline and tongue  movements are normal.  Motor Exam: Normal bulk. No tremors, myoclonus, or other adventitious movement. Pronator drift is present on left side ,  absent is on right side. RUE: 5/5 grossly throughout. LUE: 5/5 grossly throughout. RLE: 5/5 grossly throughout. LLE: 5/5 grossly throughout.  Reflexes:  DTRs are ++ and symmetric throughout.  Sensory exam:  Sensation normal to light touch and vibration in both hands and both feet.She has left sided extinction.   Cerebellar/Coordination: Rapid alternating movements are normal bilaterally. Finger-to-nose bilaterally demonstrates no abnormalities .  Gait: Deferred      Diagnostic Studies    All Labs Last 24hrs:  Recent Results (from the past 24 hours)  ECG 12 Lead   Collection Time: 05/16/24  8:15 AM  Result Value Ref Range   EKG Systolic BP  mmHg   EKG Diastolic BP  mmHg   EKG Ventricular Rate 71 BPM   EKG Atrial Rate 71 BPM   EKG P-R Interval 188 ms   EKG QRS Duration 76 ms   EKG Q-T Interval 412 ms   EKG QTC Calculation 447 ms   EKG Calculated P Axis 55 degrees   EKG Calculated R Axis 60 degrees   EKG Calculated T Axis 81 degrees   QTC Fredericia 436 ms  aPTT   Collection Time: 05/16/24  8:43 AM  Result Value Ref Range   APTT 22.8 (L) 24.8 - 38.4 sec   Heparin Correlation <0.2   PT-INR   Collection Time: 05/16/24  8:43 AM  Result Value Ref Range   PT 13.7 (H) 9.9 - 12.6  sec   INR 1.21   Fibrinogen    Collection Time: 05/16/24  8:43 AM  Result Value Ref Range   Fibrinogen  229 175 - 500 mg/dL  D-Dimer, Quantitative   Collection Time: 05/16/24  8:43 AM  Result Value Ref Range   D-Dimer 30,179 (H) <=500 ng/mL FEU  Hepatic Function Panel   Collection Time: 05/16/24  8:44 AM  Result Value Ref Range   Albumin 4.1 3.4 - 5.0 g/dL   Total Protein 7.3 5.7 - 8.2 g/dL   Total Bilirubin 0.5 0.3 - 1.2 mg/dL   Bilirubin, Direct 9.79 0.00 - 0.30 mg/dL   AST 23 <=65 U/L   ALT 15 10 - 49 U/L   Alkaline Phosphatase 86 46 - 116  U/L  Basic Metabolic Panel   Collection Time: 05/16/24  8:44 AM  Result Value Ref Range   Sodium 138 135 - 145 mmol/L   Potassium 3.4 3.4 - 4.8 mmol/L   Chloride 109 (H) 98 - 107 mmol/L   CO2 22.0 20.0 - 31.0 mmol/L   Anion Gap 7 5 - 14 mmol/L   BUN 15 9 - 23 mg/dL   Creatinine 8.82 (H) 9.44 - 1.02 mg/dL   BUN/Creatinine Ratio 13    eGFR CKD-EPI (2021) Female 54 (L) >=60 mL/min/1.52m2   Glucose 107 70 - 179 mg/dL   Calcium 9.2 8.7 - 89.5 mg/dL  Type and Screen with Confirmation ABORh on admission   Collection Time: 05/16/24  8:44 AM  Result Value Ref Range   ABO Grouping A POS   Magnesium Level   Collection Time: 05/16/24  8:44 AM  Result Value Ref Range   Magnesium 2.0 1.6 - 2.6 mg/dL  Phosphorus Level   Collection Time: 05/16/24  8:44 AM  Result Value Ref Range   Phosphorus 4.6 2.4 - 5.1 mg/dL  Lactate dehydrogenase   Collection Time: 05/16/24  8:44 AM  Result Value Ref Range   LDH 443 (H) 120 - 246 U/L  CBC w/ Differential   Collection Time: 05/16/24  8:44 AM  Result Value Ref Range   WBC 113.7 (HH) 3.6 - 11.2 10*9/L   RBC 3.35 (L) 3.95 - 5.13 10*12/L   HGB 10.4 (L) 11.3 - 14.9 g/dL   HCT 67.9 (L) 65.9 - 55.9 %   MCV 95.5 77.6 - 95.7 fL   MCH 31.0 25.9 - 32.4 pg   MCHC 32.5 32.0 - 36.0 g/dL   RDW 84.3 (H) 87.7 - 84.7 %   MPV 7.5 6.8 - 10.7 fL   Platelet 31 (L) 150 - 450 10*9/L  Lipid Panel   Collection Time: 05/16/24  8:44 AM  Result Value Ref Range   Cholesterol, Total 147 <200 mg/dL   Cholesterol, HDL 25 (L) >50 mg/dL   Cholesterol, LDL, Calculated 88 <100 mg/dL   Cholesterol, Non-HDL, Calculated 122 <130 mg/dL   Triglycerides 777 (H) <150 mg/dL   Fasting Unknown   Hemoglobin A1c   Collection Time: 05/16/24  8:44 AM  Result Value Ref Range   Hemoglobin A1C 5.5 4.8 - 5.6 %   Estimated Average Glucose 111 mg/dL  SMEAR - MD REQUEST   Collection Time: 05/16/24  8:44 AM  Result Value Ref Range   Smear - MD Request Slide Prepared for MD Review   Uric acid    Collection Time: 05/16/24  8:44 AM  Result Value Ref Range   Uric Acid 6.4 3.1 - 7.8 mg/dL  TSH   Collection Time: 05/16/24  8:44 AM  Result Value Ref Range   TSH 1.113 0.550 - 4.780 uIU/mL  ABO/RH   Collection Time: 05/16/24  8:47 AM  Result Value Ref Range   ABO Grouping A POS     I, personally reviewed: CT head wo cont from 05/15/24 which revealed cytotoxic edema in right temporoparietal region extending  into the posterior right frontal lobe with mild mass effect .              [1] Allergies Allergen Reactions   Sulfa (Sulfonamide Antibiotics) Anaphylaxis   Ampicillin Rash   Prednisone  Anxiety  [2] No past medical history on file. [3] Past Surgical History: Procedure Laterality Date   APPENDECTOMY     CESAREAN SECTION    [4] Social History Socioeconomic History   Marital status: Married  Tobacco Use   Smoking status: Every Day    Current packs/day: 0.50    Average packs/day: 0.5 packs/day for 46.0 years (23.0 ttl pk-yrs)    Types: Cigarettes    Start date: 05/26/1978  Substance and Sexual Activity   Alcohol use: No   Social Drivers of Health   Food Insecurity: No Food Insecurity (05/16/2024)   Hunger Vital Sign    Worried About Running Out of Food in the Last Year: Never true    Ran Out of Food in the Last Year: Never true  Tobacco Use: High Risk (05/16/2024)   Patient History    Smoking Tobacco Use: Every Day    Smokeless Tobacco Use: Unknown  Transportation Needs: No Transportation Needs (05/16/2024)   PRAPARE - Transportation    Lack of Transportation (Medical): No    Lack of Transportation (Non-Medical): No  Housing: Low Risk (05/16/2024)   Housing    Within the past 12 months, have you ever stayed: outside, in a car, in a tent, in an overnight shelter, or temporarily in someone else's home (i.e. couch-surfing)?: No    Are you worried about losing your housing?: No  Financial Resource Strain: Low Risk (05/16/2024)   Overall  Financial Resource Strain (CARDIA)    Difficulty of Paying Living Expenses: Not very hard  [5] Family History Problem Relation Age of Onset   Kidney failure Father    Pancreatic cancer Sister    Ovarian cancer Maternal Grandmother    Colon cancer Paternal Grandmother   "

## 2024-05-16 NOTE — Care Plan (Signed)
 Pt aox4, afebrile, vss, room air; Dfficulty swallowing pills, oral suspension and iv meds ordered, tolerated those well; Chemo ordered dt emergent leukemia diagnosis; pt education provided; Labs collected; Pre-meds adminned (steroid, fluids, anti-emetics), chemo dc'd at the last minute; Pt's became agitated and restless 2hrs post-premeds administration, staff unable to redirect;  PIV pulled out, pt unable to sit/lay still; pt complained of steroids causing her 'legs to kick.'    RN alerted HCP to this sudden change, when hcp unresponsive to epic chats and pages, charge rn, associate professor, and attending notified;  rapid called; Pt refused imaging (MRI stat, CT scan stat), refused anti-anxiety meds (ativan), was unable to redirect attention at shift change; Sitter ordered at eos dt fall risk and pt's inability to stay still;   Problem: Adult Inpatient Plan of Care Goal: Absence of Hospital-Acquired Illness or Injury Intervention: Identify and Manage Fall Risk Recent Flowsheet Documentation Taken 05/16/2024 1800 by Hughie Rosa KIDD, RN Safety Interventions: bleeding precautions Taken 05/16/2024 1600 by Hughie Rosa KIDD, RN Safety Interventions: bleeding precautions Taken 05/16/2024 1400 by Hughie Rosa KIDD, RN Safety Interventions: bleeding precautions Taken 05/16/2024 1200 by Hughie Rosa KIDD, RN Safety Interventions: bleeding precautions Taken 05/16/2024 1000 by Hughie Rosa KIDD, RN Safety Interventions: bleeding precautions Taken 05/16/2024 0800 by Hughie Rosa KIDD, RN Safety Interventions: bleeding precautions Intervention: Prevent Skin Injury Recent Flowsheet Documentation Taken 05/16/2024 1800 by Hughie Rosa KIDD, RN Positioning for Skin: Other (Comment) Device Skin Pressure Protection:  adhesive use limited  absorbent pad utilized/changed  tubing/devices free from skin contact Skin Protection:  adhesive use limited  transparent dressing maintained  tubing/devices  free from skin contact Taken 05/16/2024 1600 by Hughie Rosa KIDD, RN Positioning for Skin: Other (Comment) Device Skin Pressure Protection:  adhesive use limited  absorbent pad utilized/changed  tubing/devices free from skin contact Skin Protection:  adhesive use limited  transparent dressing maintained  tubing/devices free from skin contact Taken 05/16/2024 1400 by Hughie Rosa KIDD, RN Positioning for Skin: Other (Comment) Device Skin Pressure Protection:  adhesive use limited  absorbent pad utilized/changed  tubing/devices free from skin contact Skin Protection:  adhesive use limited  transparent dressing maintained  tubing/devices free from skin contact Taken 05/16/2024 1200 by Hughie Rosa KIDD, RN Positioning for Skin: Other (Comment) Device Skin Pressure Protection:  adhesive use limited  absorbent pad utilized/changed  tubing/devices free from skin contact Skin Protection:  adhesive use limited  transparent dressing maintained  tubing/devices free from skin contact Taken 05/16/2024 1000 by Hughie Rosa KIDD, RN Positioning for Skin: Other (Comment) Device Skin Pressure Protection:  adhesive use limited  absorbent pad utilized/changed  tubing/devices free from skin contact Skin Protection:  adhesive use limited  transparent dressing maintained  tubing/devices free from skin contact Taken 05/16/2024 0800 by Hughie Rosa KIDD, RN Positioning for Skin: (pt repositions self) Other (Comment) Device Skin Pressure Protection:  adhesive use limited  absorbent pad utilized/changed  tubing/devices free from skin contact Skin Protection:  adhesive use limited  transparent dressing maintained  tubing/devices free from skin contact   Problem: Skin Injury Risk Increased Goal: Skin Health and Integrity Intervention: Optimize Skin Protection Recent Flowsheet Documentation Taken 05/16/2024 1800 by Hughie Rosa KIDD, RN Activity  Management:  in bed  dangle, side of bed Pressure Reduction Techniques:  frequent weight shift encouraged  weight shift assistance provided Head of Bed (HOB) Positioning: HOB elevated Pressure Reduction Devices:  specialty bed utilized  pressure-redistributing mattress utilized Skin Protection:  adhesive use limited  transparent dressing maintained  tubing/devices free from skin contact Taken 05/16/2024 1600 by Hughie Rosa KIDD, RN Activity Management:  in bed  dangle, side of bed Pressure Reduction Techniques:  frequent weight shift encouraged  weight shift assistance provided Head of Bed (HOB) Positioning: HOB elevated Pressure Reduction Devices:  specialty bed utilized  pressure-redistributing mattress utilized Skin Protection:  adhesive use limited  transparent dressing maintained  tubing/devices free from skin contact Taken 05/16/2024 1400 by Hughie Rosa KIDD, RN Activity Management:  in bed  dangle, side of bed Pressure Reduction Techniques:  frequent weight shift encouraged  weight shift assistance provided Head of Bed (HOB) Positioning: HOB elevated Pressure Reduction Devices:  specialty bed utilized  pressure-redistributing mattress utilized Skin Protection:  adhesive use limited  transparent dressing maintained  tubing/devices free from skin contact Taken 05/16/2024 1200 by Hughie Rosa KIDD, RN Activity Management:  in bed  dangle, side of bed Pressure Reduction Techniques:  frequent weight shift encouraged  weight shift assistance provided Head of Bed (HOB) Positioning: HOB elevated Pressure Reduction Devices:  specialty bed utilized  pressure-redistributing mattress utilized Skin Protection:  adhesive use limited  transparent dressing maintained  tubing/devices free from skin contact Taken 05/16/2024 1000 by Hughie Rosa KIDD, RN Activity Management:  in bed  dangle, side of bed Pressure Reduction  Techniques:  frequent weight shift encouraged  weight shift assistance provided Head of Bed (HOB) Positioning: HOB elevated Pressure Reduction Devices:  specialty bed utilized  pressure-redistributing mattress utilized Skin Protection:  adhesive use limited  transparent dressing maintained  tubing/devices free from skin contact Taken 05/16/2024 0800 by Hughie Rosa KIDD, RN Activity Management:  in bed  dangle, side of bed Pressure Reduction Techniques:  frequent weight shift encouraged  weight shift assistance provided Head of Bed (HOB) Positioning: HOB elevated Pressure Reduction Devices:  specialty bed utilized  pressure-redistributing mattress utilized Skin Protection:  adhesive use limited  transparent dressing maintained  tubing/devices free from skin contact

## 2024-05-16 NOTE — BH Treatment Plan (Signed)
 Treatment Plan Note  Discussed pt's case in conference. Will defer IV cytoxan for now and continue cytoreduction with hydrea  while closely monitoring her neuro exam. Will start Dex for c/f cytoxic edema.   AML, c/f mass lesions: Presented to OSH 05/15/2024 w a few days of fatigue, dropping things, and a recent fall due to loss of balance. OSH blood work revealed WBC 139.9, Hgb 10.3, Plt 34. CT head performed initially concern for acute/subacute infarcts (see below), concerning for leukostasis. However per neurology read here, demonstrated a cytotoxic edema with mild mass effect; concern that the lesions may represent mass lesions rather than ischemic infarcts. No changes in vision or issues with breathing, or other findings concerning for leukostasis. Given initial concern for leukostasis, discussed the need for leukopheresis w transfusion medicine; deferred due to apheresis staffing availability. Initially planned for IV high dose cytoxan but given decreasing WBC count and stable neuro exam, will monitor on Hydrea  overnight. Mildly elevated Cr and elevated LDH on admission, otherwise no signs of TLS or DIC. 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  - discussed at conference 12/22, will defer Cytoxan for now  - hydroxyurea  2g q4hrs - allopurinol  300mg  BID - normal saline 150/hr  - ppx: Valtrex, Levaquin  - CBC, TLS labs q6 hrs - HFP, DIC profile daily  - requested study team visit pt tomorrow for pre screening consent, pt aware  [ ]  plan for bone marrow tomorrow pending tech availability and screening consent timing  [ ]  peripheral blood cytogenetics, MMP, pathology smear review pending [ ]  HIV, non-acute hepatitis panel pending [ ]  G6PD level pending [ ]  echo pending (for stroke workup as below)  - HGB transfusion indications order removed due to concern for leukostasis  [ ]  plan to send for HLA typing tomorrow - line placement pending further work up   - EKG performed c/f nonspecific ST  abnormality. Pt asymptomatic and no note worthy elevation noted on review, but given initial concern for leukostasis will trend troponins.  [ ]  troponin trend   Cytotoxic edema in right temporoparietal region extending into the posterior right frontal lobe with mild mass effect: CT head at OSH 05/15/24 reports large R temporoparietal acute or subacute infarct, possible L frontal acute or subacute infarct.  However per neurology read here, demonstrated a cytotoxic edema with mild mass effect; concern that the lesions may represent mass lesions rather than ischemic infarcts. On day of admission, 4+/5 strength in L upper extremity and lack of sensation on L thigh initially but resolved on re exam that afternoon. Lipid panel w low HDL and high triglycerides; A1C normal. - cytoreduction as above  - Dex 6 BID for edema  [ ]  MRI brain w wo stat  [ ]  TTE w bubble study  - continue tele  - neuro exam q4 hr  - neurology following    Attending Dr. JULIANNA Mcbride informed of above.

## 2024-05-16 NOTE — Consults (Signed)
 Pharmacy: First Cycle Chemotherapy Patient Education Carla Weaver is a 63 y.o. year old with leukocytosis who was admitted to [facility] and will receive their first cycle of chemotherapy. Patient education reviewing the chemotherapy is provided to the patient by the oncology pharmacy team.  Patient and her family (husband, daughter, daughter-in social worker) were present for education.    Chemotherapy regimen/agents: cyclophosphamide  Day 1 of chemotherapy: 12/22 Central Venous Access Device: N\/A - PIV  For each of the following items listed below and where applicable, signs and symptoms of adverse effects as well as self-care management were reviewed. Additionally, chemotherapy scheduling, chemotherapy agents, mechanism of action, exposure risk mitigation for caregivers, drug-drug/food/herbal interactions and supportive care measures were also reviewed with the patient.   Side effects discussed included but were not limited to: neutropenia/febrile neutropenia, thrombocytopenia, anemia, nausea/vomiting, mucositis, and hemorrhagic cystitis.  A/P: 1. The patient verbalized understanding of this information. The patient's drug profile was reviewed for drug interactions with the chemotherapy and no interactions were present at the time of review. The patient was given educational materials that included resources from the [facility] patient education library, [employer]OP[employer], [employer], EPIC, Product/process Development Scientist.   Approximate time spent with patient: 21 minutes.  Thank you, please feel free to contact with any questions or concerns.  Norlene Snare, Pharm D. [facility]OP Clinical Pharmacy Specialist, Hematology/Oncology

## 2024-05-16 NOTE — Nursing Note (Signed)
 Spiritual Care Visit Note  Assessment Summary: Chaplain Young responded to ARR. Pt expressed irritation and was refusing recommended care. Chaplain Young used interventions to aid pt to uses personal resources to calm self, pt refused.   Clinical Encounter Type Type of Visit: Initial visit Care Provided To: Patient and family together Referral Source: Adult Rapid Response On-Call Visit?: Yes Reason for Visit: Rapid response Minutes Spent: 35 minutes     Spiritual Assessment Faith: No religion on file Presenting Concern(s): Anger Emotions: Disatisfation, irritation Community: Family  Spiritual Care Interventions Interventions Made: Established relationship of care and support, Compassionate presence, Reflective listening, Relaxation/mindfulness intervention, Non-anxious presence Outcomes: Issues still outstanding (Comment) (Pt remained irritated, suggested moment to cool off)    Spiritual Care Plan Spiritual Care Plan: No active spiritual needs at this time. Please consult if new needs emerge.    Signed: Gerrit DELENA Salt, Chaplain 7:34 PM 05/16/2024

## 2024-05-16 NOTE — Consults (Signed)
 "      Care Management Initial Transition Planning Assessment  Patient lives with husband in  [place] , [place], in a 2 level home with 5 steps to enter.  At baseline patient is independent with ADLs . She does not use any DME.  Husband will drive patient home and assist with basic care.    Type of Residence: Mailing Address:  [address] [place] [address] Contacts:   Patient Phone Number: [phone] (mobile)      Medical Provider(s): Avelina Greig BRAVO, MD Reason for Admission: Admitting Diagnosis:  leukocytosis Past Medical History:   has no past medical history on file. Past Surgical History:   has a past surgical history that includes Appendectomy and Cesarean section.  Previous admit date: N/A  Editor, Commissioning- Payor: /  Social Research Officer, Government - None Prescription Coverage - none Preferred Pharmacy -   Transportation home: Tax Inspector / Social Worker assessed the patient by : In person interview with patient Orientation Level: Oriented X4 Functional level prior to admission: Independent Reason for referral: Discharge Planning  Contact/Decision Maker Extended Emergency Contact Information Primary Emergency Contact: Mineau,Robert Address: [address]          [place], [place] [address] [place] Home Phone: [phone] Relation: Spouse  Legal Next of Kin / Guardian / POA / Advance Directives    HCDM (patient stated preference): December, Hedtke - Spouse - [phone]  Advance Directive (Medical Treatment) Does patient have an advance directive covering medical treatment?: Patient does not have advance directive covering medical treatment. Reason patient does not have an advance directive covering medical treatment:: Patient does not wish to complete one at this time.  Health Care Decision Maker [HCDM] (Medical & Mental Health Treatment) Healthcare Decision Maker: HCDM documented in the HCDM/Contact Info  section. Information offered on HCDM, Medical & Mental Health advance directives:: Patient given information.  Advance Directive (Mental Health Treatment) Does patient have an advance directive covering mental health treatment?: Patient does not have advance directive covering mental health treatment. Reason patient does not have an advance directive covering mental health treatment:: Patient does not wish to complete one at this time.  Readmission Information  Have you been hospitalized in the last 30 days?: No  Did the following happen with your discharge?  Patient Information Lives with: Spouse/significant other  Type of Residence: Private residence     Location/Detail: 2 story with 5 steps to enter     Responsibilities/Dependents at home?: No  Home Care services in place prior to admission?: No    Equipment Currently Used at Home: none    Currently receiving outpatient dialysis?: N/A    Financial Information    Need for financial assistance?: No    Social Drivers of Health Social Drivers of Health   Food Insecurity: No Food Insecurity (05/16/2024)   Hunger Vital Sign    Worried About Running Out of Food in the Last Year: Never true    Ran Out of Food in the Last Year: Never true  Tobacco Use: High Risk (05/16/2024)   Patient History    Smoking Tobacco Use: Every Day    Smokeless Tobacco Use: Unknown    Passive Exposure: Not on file  Transportation Needs: No Transportation Needs (05/16/2024)   PRAPARE - Transportation    Lack of Transportation (Medical): No    Lack of Transportation (Non-Medical): No  Alcohol Use: Not on file  Housing:  Low Risk (05/16/2024)   Housing    Within the past 12 months, have you ever stayed: outside, in a car, in a tent, in an overnight shelter, or temporarily in someone else's home (i.e. couch-surfing)?: No    Are you worried about losing your housing?: No  Physical Activity: Not on file  Utilities: Not on file   Stress: Not on file  Interpersonal Safety: Not on file  Substance Use: Not on file (05/16/2024)  Intimate Partner Violence: Not on file  Social Connections: Not on file  Financial Resource Strain: Low Risk (05/16/2024)   Overall Financial Resource Strain (CARDIA)    Difficulty of Paying Living Expenses: Not very hard  Health Literacy: Not on file  Internet Connectivity: Not on file    Complex Discharge Information  Is patient identified as a difficult/complex discharge?: No                                       Interventions:    Discharge Needs Assessment Concerns to be Addressed: other (see comments) (CM will follow for any discharge needs.)  Clinical Risk Factors: Principal Diagnosis: Cancer, Stroke, COPD, Heart Failure, AMI, Pneumonia, Joint Replacment  Barriers to taking medications: No  Prior overnight hospital stay or ED visit in last 90 days: No        Anticipated Changes Related to Illness: other (see comments) (CM will follow for any discharge needs.)  Equipment Needed After Discharge: other (see comments) (CM to follow for discharge DME needs.)  Discharge Facility/Level of Care Needs: other (see comments) (CM will follow for any discharge needs.)  Readmission Risk of Unplanned Readmission Score: UNPLANNED READMISSION SCORE: 6.3% Predictive Model Details        6% (Low)  Factor Value   Calculated 05/16/2024 08:05 33% ECG/EKG order present in last 6 months   [facility] Risk of Unplanned Readmission Model 24% Imaging order present in last 6 months    22% Number of active inpatient medication orders 9    17% Age 54    4% Charlson Comorbidity Index 1    0% Current length of stay 0.095 days    Readmitted Within the Last 30 Days? (No if blank)  Patient at risk for readmission?: Yes  Discharge Plan Screen findings are: Care Manager reviewed the plan of the patient's care with the Multidisciplinary Team. No discharge planning needs identified  at this time. Care Manager will continue to manage plan and monitor patient's progress with the team.  Expected Discharge Date:   Expected Transfer from Critical Care:    Quality data for continuing care services shared with patient and/or representative?: Yes Patient and/or family were provided with choice of facilities / services that are available and appropriate to meet post hospital care needs?: Other (Comment) (Choice of facilities/services will be provided if deemed medically necessary.)    Initial Assessment complete?: Yes    "

## 2024-05-16 NOTE — Progress Notes (Signed)
 ------------------------------------------------------------------------------- Attestation signed by Rozelle Heller, MD at 05/16/24 2052 Please see attestation to H&P dated 12/22 for full attestation.  Updates this afternoon: flow cytometry consistent with AML. Discussed Carla Weaver cause with leukemia specialist Dr Zeidner. Deferring Cytoxan currently as neuro exam stable and WBC downtrending with hydrea  and would make Carla Weaver ineligible for most AML trials. Carla Weaver more agitated this evening, may be steroid psychosis. Declined labs and hydrea  and able to verbalize possible consequences of Carla Weaver refusal. Will reassess for next doses and may involve psychiatry in AM if Carla Weaver remains agitated.   Heller Rozelle, MD PhD  -------------------------------------------------------------------------------  Daily Progress Note   Principal Problem:   Leukocytosis Active Problems:   Hypertension   CVA (cerebral vascular accident)    (CMS-HCC)   Tobacco use    LOS: 0 days   Assessment/Plan: Carla Weaver is an 63 y.o. female who was admitted for leukocytosis and concern for acute leukemia, also found to have acute/subacute infarcts on head imaging.  Plan Summary: Leukocytosis w c/f ~90% blasts on smear review and c/f leukostasis; will administer cytoxan today, continue hydrea  2q4, continuous fluids 150/hr, allopurinol  BID, CBC/TLS labs q6 hr. Trending troponin given leukostasis concern and reported concern for ST changes on EKG although none appreciated on review. Mildly elevated Cr and elevated LDH but otherwise TLS labs stable. Full leukemia work up pending. CT head OSH c/f multiple acute/subacute infarcts, neuro exam w mild LUE weakness and left thigh lack of sensation; TTE and MRI brain pending, cytoreduction as above; neurology following. Carla Weaver with difficulty swallowing pills, reports long standing but consulting SLP for swallow test. Further details as outlined below.    Leukocytosis  C/F leukostasis: Presented to  OSH 05/15/2024 w a few days of fatigue, dropping things, and a recent fall due to loss of balance. OSH blood work revealed WBC 139.9, Hgb 10.3, Plt 34. CT head performed c/f acute/subacute infarcts (see below), concerning for leukostasis. No changes in vision or issues with breathing. Mildly elevated Cr and elevated LDH, otherwise no signs of TLS or DIC. Given concern for leukostasis, discussed the need for leukopheresis w transfusion medicine; deferred due to apheresis staffing availability, can revisit tomorrow pending apheresis' schedule. Will utilize Cytoxan on admission.  - high dose cytoxan: 12/22  - hydroxyurea  2g q4hrs - allopurinol  300mg  BID - normal saline 150/hr  - ppx: Valtrex, Levaquin  - CBC, TLS labs q6 hrs - HFP, DIC profile daily  [ ]  peripheral blood hemepath flow, requested run stat [ ]  peripheral blood cytogenetics, MMP, pathology smear review pending [ ]  HIV, non-acute hepatitis panel pending [ ]  G6PD level pending [ ]  bone marrow biopsy likely later this week  [ ]  echo pending (for stroke workup as below)  - HGB transfusion indications order removed due to concern for leukostasis  - if AML send for HLA typing  - line placement pending diagnosis  - EKG performed as part of CVA workup c/f nonspecific ST abnormality. Carla Weaver asymptomatic and no note worthy elevation noted on review, but given high concern for leukostasis w trend troponins.  [ ]  troponin trend   C/f Acute vs subacute CVA: CT head at OSH 05/15/24 reports large R temporoparietal acute or subacute infarct, possible L frontal acute or subacute infarct. On day of admission, 4+/5 strength in L upper extremity and lack of sensation on L thigh. Unclear if CVA related to leukocytosis vs alternate etiology. Lipid panel w low HDL and high triglycerides; A1C normal.  - cytoreduction as above  [ ]   MRI brain w wo expedite  [ ]  TTE w bubble study  - continue tele  - neurology consulted   Reported Dysphagia: Carla Weaver reports  difficulty swallowing pill and unable to swallow multiple pills on 12/22 AM. Reports long standing issue.  - speech consulted for swallow study  - medications in solutions/IV as able   Constipation: no BM for about 1 week prior to admission. Although notes lack of appetite.  - Miralax daily  - nutrition consulted   Hx HTN: Reports previously on atenolol  but has not taken anything in years. - CTM  Cervical Cancer (in remission): Reports previously diagnosed with cervical cancer 10 years ago; underwent cone biopsy and did not need major surgery, chemotherapy, or radiation.  Cancer-related fatigue: Carla Weaver endorses fatigue over the last few days. Primary symptoms are generalized weakness and lethargy.   Immunocompromised status: Carla Weaver is immunocompromised secondary to underlying disease. - Antimicrobial prophylaxis as above   Impending electrolyte abnormality: Secondary to chemotherapy and/or IV fluid resuscitation. - Daily electrolyte monitoring - Replete per [facility] guidelines  Pancytopenias:  - Transfuse 1 unit plt for plts < 10k - discussion HGB transfusions w attending due to concern for leukocytosis   Nutrition:                Subjective:  Interval History: Carla Weaver reports felling well today and would like to go home. Reports fatigue lasting for only a few days. Overall lack of appetitie and no BM for about a week. Denies vision changes, difficulty breathing, fevers, chills, or other concerns.   10 point ROS otherwise negative except as above in the HPI.   Objective:  Vital signs in last 24 hours: Temp:  [36.6 C (97.9 F)-37.1 C (98.7 F)] 37.1 C (98.7 F) Pulse:  [70-100] 70 SpO2 Pulse:  [70-97] 70 Resp:  [16-25] 16 BP: (146-171)/(43-91) 171/43 MAP (mmHg):  [86-108] 86 SpO2:  [97 %-99 %] 99 % BMI (Calculated):  [23.23] 23.23  Intake/Output last 3 shifts: No intake/output data recorded.  Meds: Current Medications[1]  Physical Exam: General: Resting, in no  apparent distress, lying in bed HEENT:  PERRL. No scleral icterus or conjunctival injection. MMM without ulceration, erythema or exudate.  Heart:  RRR. S1, S2. No murmurs, gallops, or rubs. Lungs:  Breathing is unlabored, and Carla Weaver is speaking full sentences with ease. No stridor. CTAB. No rales, ronchi, or crackles.   Abdomen:  No distention or pain on palpation. Bowel sounds are present and normoactive.  Skin:  No rashes, petechiae or purpura on clothed exam.  Musculoskeletal:  No grossly-evident joint effusions or deformities. Range of motion about the shoulder, elbow, hips and knees is grossly normal. Psychiatric:  Range of affect is appropriate.   Neurologic:  Alert and oriented to person, place, time and situation. 4+/5 strength in LUE, 5/5 in RUE. 5/5 strength in b/l lower extremities. Lack of sensation in left thigh. Remainder of neuro exam normal.  Extremities:  Appear well-perfused. No clubbing, edema, or cyanosis. CVAD: none   Labs: Recent Labs    05/16/24 0843 05/16/24 0844  WBC  --  113.7*  HGB  --  10.4*  HCT  --  32.0*  PLT  --  31*  CREATININE  --  1.17*  BUN  --  15  BILITOT  --  0.5  BILIDIR  --  0.20  AST  --  23  ALT  --  15  ALKPHOS  --  86  K  --  3.4  MG  --  2.0  CALCIUM  --  9.2  NA  --  138  CL  --  109*  CO2  --  22.0  PHOS  --  4.6  INR 1.21  --     Imaging: CT Head OSH 12/21 1. 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  2. Possible additional  left frontal acute or subacute infarct.  3. MRI is recommended for further evaluation .  4. Critical value/emergent results were called by telephone at the time of  interpretation on 05/15/24 @ 8:50 pm to Dr. Viviann, who verbally acknowledged  these results.   Leeroy JINNY Halsted, PA,  05/16/2024  12:31 PM  Hematology/Oncology Department  [facility] pager: [phone]       [1] Current Facility-Administered Medications  Medication Dose Route Frequency Provider Last Rate  Last Admin   allopurinol  (ZYLOPRIM ) oral suspension  300 mg Oral Daily Tuck, Ellen Jordan, PA   300 mg at 05/16/24 1124   emollient combination no.92 (LUBRIDERM) lotion 1 Application  1 Application Topical Q1H PRN Bhatta, Manasa R, MD       hydroxyurea  (XROMI ) oral solution 2,000 mg  2,000 mg Oral Q4H Tuck, Ellen Jordan, PA   2,000 mg at 05/16/24 1124   levoFLOXacin (LEVAQUIN) oral solution  500 mg Oral daily Moses-Lebron, Alyse Jenna, PA       loperamide (IMODIUM) capsule 2 mg  2 mg Oral Q2H PRN Bhatta, Manasa R, MD       loperamide (IMODIUM) capsule 4 mg  4 mg Oral Once PRN Bhatta, Manasa R, MD       polyethylene glycol (MIRALAX) packet 17 g  17 g Oral Daily Tuck, Ellen Jordan, [place]       sodium chloride  (NS) 0.9 % flush 10 mL  10 mL Intravenous BID Bhatta, Manasa R, MD       sodium chloride  (NS) 0.9 % flush 10 mL  10 mL Intravenous BID Bhatta, Manasa R, MD       sodium chloride  (NS) 0.9 % flush 10 mL  10 mL Intravenous BID Bhatta, Manasa R, MD   10 mL at 05/16/24 0831   sodium chloride  (NS) 0.9 % infusion  150 mL/hr Intravenous Continuous Tuck, Ellen Jordan, PA 150 mL/hr at 05/16/24 0828 150 mL/hr at 05/16/24 0828   valACYclovir (VALTREX) oral suspension  500 mg Oral Daily Moses-Lebron, Alyse Jenna, PA   500 mg at 05/16/24 1124

## 2024-05-16 NOTE — BH Treatment Plan (Signed)
 Coming to bedside when RRT was called when I got to the room  While I was with another patient and I was paged to check my epic chat regarding agitation for [name].  Her primary provider was in the chats and discussing with the nurse.  When I came to review my epic chaps within the next 15 minutes, the nurse had reported that the patient was agitated and walking around and kicking her legs.  I was told she was alert and oriented.  They requested I come to bedside and I did.  Her vitals were stable.  Her blood pressure was a little bit elevated systolic of 151.  She was alert and oriented x 4.  I recommended ordering a stat CT scan and giving an anxiolytic.  I called Dr. Rozelle, who agreed with the plan.  We discussed that Zyprexa was not an option as she has just gotten Zyprexa.  We also thought through Seroquel but was concerned to give an additional antipsychotic with the previous Zyprexa dose.  So we settled on the stat CT and Ativan.  I went back in and talk to the patient about the plan.  The house supervisor asked if she was willing to do that and the patient declined.  I asked the patient what would happen if she did not get a CT scan.  She replied that she may die, but she was going to go when the good Lord took her.  It was from the statements that we are able to determine that she had capacity to decline interventions.  After discussion with charge nurse and nurse manager and bedside nurse, we determined that we could do a one-to-one sitter, and that she had capacity at this time to decline interventions as she was not a harm to herself or others.  The nurse manager expressed that she did not want her walking as she was somewhat unsteady on her feet.  And we agreed that the one-to-one sitter was the best option right now.  She does still have Ativan and the CT scan ordered.  I have held Dex doses as we do think that this is related to psychomotor agitation from steroids at this time.  However  without a CT scan we are unable to determine if there is a neurologic issue as the cause.  During the rapid response neurology did come by and reassess, they did not have any additional recommendations.  The ICU also did come by, however her vitals are stable and she was alert and oriented and had capacity so we did not escalate level of care.  At the end of the rapid we debriefed with the nurse, nurse manager and decided to document what medication she was declining.  She also declined repeat lab work during the rapid.  If this persists we will consider adding a psychiatry consult in the morning.  Dr. Rozelle was aware of this and agrees with the plan  Exam:  General: pacing the room and sitting on bed intermittently kicking her legs Neuro: A&Ox4, fast rate of speech, fast impulsive movements. Fast/jerky gait. Lungs: NWOB on RA, no audible wheezes Skin: Scattered ecchymosis, no signs of rash on clothed exam.  MSK: no notable edema in BLE   Recommendations:  Stat head CT 1mg  ativan Hold dex doses Consider psych consult Reassess if patient has capacity if she wants to leave AMA.  Will retrial labs/hydrea  (patient declined during RRT) later this evening.

## 2024-05-16 NOTE — Nursing Note (Signed)
"   ICU TRANSPORT NOTE  Destination: CT  Departing Unit: [facility] Pickup Time: 2220  Return Unit: [facility] Return Time: 2240  [facility] patient ID band verified Allergies Reviewed Code Status at time of transport: Full  Report received from primary nurse via [facility]. Handoff performed . Patient transported via stretcher under stepdown level of care. See vital signs during transport via [employer]. O2 via RA. Patient is alert, restless, and agitated. Patient tolerated scan but was restless and did have difficulty lying still or following commands longer than momentarily, unable to obtain BP while off unit as pt was moving arm around, placing forearm on forehead etc. Universal precautions maintained throughout transport.  Update and care given to primary nurse. See Doc Flowsheets/MAR for additional transportation documentation. Proper body mechanics and safe patient handling equipment were utilized throughout transport.   "

## 2024-05-16 NOTE — Consult Note (Signed)
 [facility] TeleNeurology Consult Services  Stat Consult  Patient Name:   Carla Weaver, Arntz Date of Birth:   07-17-1960 Identification Number:   MRN - [id number] Date of Service:   05/15/2024 21:50:52  Diagnosis:       I63.89 - Cerebrovascular accident (CVA) due to other mechanism [facility])  Impression 63 y/o female with hx of HTN presenting with malaise, dropping things LKN last night 9:30 PM. NIHSS of 2. Significant for sensory extinction on left side. Head CT with multifocal hypodensities larger right parietotemporal with some degree of edema. other labs remarkable for leukocytosis, concerning for potential leukemia. Differentials: leukemia CNS involvement vs multifocal CVA  Recommendations: 1. Brain MRI WITH and W/O 2. Permissive hypertension up to 220 systolic and 110 diastolic 3. If evidence of acute ischemia on brain MRI, add aspirin 81mg  daily and complete stroke workup:TTE with bubble,A1c,LDL,TSH   Recommendations: Our recommendations are outlined below.  Nursing Recommendations : IV Fluids, avoid dextrose containing fluids, Maintain euglycemia Neuro checks q4 hrs x 24 hrs and then per shift Head of bed 30 degrees Continue with Telemetry  Consultations : Recommend Speech therapy if failed dysphagia screen Physical therapy/Occupational therapy    ----------------------------------------------------------------------------------------------------    Metrics: Callback Response Time: 05/15/2024 21:51:17  Primary Provider Notified of Diagnostic Impression and Management Plan on: 05/16/2024 00:31:21     ----------------------------------------------------------------------------------------------------  Chief Complaint: CT head with large temporoparietal hypodensity concerning for subacute infarct  History of Present Illness: Patient is a 63 year old Female. 27F Presents with malaise, no appetite x 3 days. Patient woke up today severely fatigued  and uncoordinated. non focal neuro exam. WBC is elevated. Hx: HTN Imaging: CT head completed - large area in temporal parietal area possible sub acute infarct.    Past Medical History:      Hypertension  Medications:  No Anticoagulant use  No Antiplatelet use Reviewed EMR for current medications  Allergies:  Reviewed  Social History: Smoking: Yes Alcohol Use: No Drug Use: No  Family History:  There is no family history of premature cerebrovascular disease pertinent to this consultation  ROS : 14 Points Review of Systems was performed and was negative except mentioned in HPI.  Past Surgical History: There Is No Surgical History Contributory To Todays Visit    Examination: BP(130/70), Pulse(56), Blood Glucose(70) 1A: Level of Consciousness - Alert; keenly responsive + 0 1B: Ask Month and Age - Both Questions Right + 0 1C: Blink Eyes & Squeeze Hands - Performs Both Tasks + 0 2: Test Horizontal Extraocular Movements - Normal + 0 3: Test Visual Fields - Partial Hemianopia + 1 4: Test Facial Palsy (Use Grimace if Obtunded) - Normal symmetry + 0 5A: Test Left Arm Motor Drift - No Drift for 10 Seconds + 0 5B: Test Right Arm Motor Drift - No Drift for 10 Seconds + 0 6A: Test Left Leg Motor Drift - No Drift for 5 Seconds + 0 6B: Test Right Leg Motor Drift - No Drift for 5 Seconds + 0 7: Test Limb Ataxia (FNF/Heel-Shin) - No Ataxia + 0 8: Test Sensation - Normal; No sensory loss + 0 9: Test Language/Aphasia - Normal; No aphasia + 0 10: Test Dysarthria - Normal + 0 11: Test Extinction/Inattention - Extinction to bilateral simultaneous stimulation + 1  NIHSS Score: 2  Spoke with : ED provider    This consult was conducted in real time using interactive audio and immunologist. Patient was informed of the technology being used for this visit and  agreed to proceed. Patient located in hospital and provider located at home/office setting.   Patient is being evaluated  for possible acute neurologic impairment and high probability of imminent or life - threatening deterioration.I spent total of 45 minutes providing care to this patient, including time for face to face visit via telemedicine, review of medical records, imaging studies and discussion of findings with providers, the patient and / or family.   Dr Annita Leisure     [facility] For Inpatient follow-up with [facility] physician please call [facility] at [phone]. As we are not an outpatient service for any post hospital discharge needs please contact the hospital for assistance.  If you have any questions for the [facility] physicians or need to reconsult for clinical or diagnostic changes please contact us  via [facility] at [phone].  Non-radiologist review of imaging performed to assist with emergent clinical decision-making. Remote physician workstations do not possess the same resolution, calibration, or diagnostic capabilities as hospital-based radiology reading stations, and formal radiologist read is necessary.   Signature : Tracker Mance

## 2024-05-17 NOTE — Care Plan (Signed)
 Received pt agitated and restless. Afebrile. VSS, on a RA. Pt refusing some of her meds. (See mar). Also pt refusing to put  the tele monitor. Then around 12am Pt tried to do AMA,  i Notify the provider and the Provider came at the bedside. We activate Behavioral response team twice.  Around 5am pt become agitated again. I notify the provide and order for a 4 point restraints.  We gave her  PRN diazepam  5mg /ml tolerated well (see mar).  Give report to the day shift nurse.

## 2024-05-17 NOTE — Unmapped External Note (Signed)
 Significant Event Note  Provider called to bedside at 0010 for patient threatening to leave AMA. Upon arrival to bedside, patient initially appeared to have capacity. She was able to tell this provider the month, year, and that she was okay with dying if she left the hospital. Psych was STAT paged for concern that patient was not actually safe to leave the hospital. After further assessment, it became clear that patient was suffering from paranoid delusions, severe psychomotor agitation, and did not understand the type of cancer that we suspect she has. She was not able to verbalize that her cancer (at least in this acute phase) is very treatable, but that without treatment, she is at a high risk of dying in a matter of days. Multiple threats and racist attacks directed at staff. Daughter at bedside confirms this is not her baseline for behavior and suspects she is withdrawing from nicotine. Patient not redirectable, attempting to enter another patient's room, stating her clothes were in there. Refusing nicotine patch or any other intervention for her agitation.  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 Proper forms completed and submitted via Epic and via hard copy with wet signature to [facility].  Patient received 5mg  IV valium  once at 0030 with immediate improvement in agitation, able to be redirected and returned to bed with sitter at bedside. However, at 0430, became severely agitated again, and for patient and staff safety, was placed in two-point soft restraints and received an additional 5mg  IV valium . Two-point restraints were insufficient to maintain staff safety, and so at 0500, four-point soft restraints were utilized.  Carla Weaver, AGNP Malignant Hematology APP [facility] 06:42am 05/17/2024

## 2024-05-18 ENCOUNTER — Telehealth: Payer: Self-pay

## 2024-05-18 LAB — COMP PANEL: LEUKEMIA/LYMPHOMA

## 2024-05-18 NOTE — Telephone Encounter (Addendum)
 Tanae with [employer] calling to speak with someone about oncology lab report. I explained pt is not [facility] pt with no establishing appt scheduled. Tanae said Dr Avelina ordered test. I advised pt has not seen Dr Avelina since 05/18/2015. Chart review tab has pt in hospital on 05/16/24 and gave attending info Dr Everitt Robin  [phone]. Jane said she would ck with that provider. Sending FYI to Dr Watt since Dr Avelina is out of office.

## 2024-05-19 LAB — GLUCOSE 6 PHOSPHATE DEHYDROGENASE
G6PDH: 8.2 U/g{Hb} (ref 5.5–14.2)
Hemoglobin: 9.6 g/dL — ABNORMAL LOW (ref 11.1–15.9)

## 2024-05-20 LAB — BCR-ABL1 FISH
Cells Analyzed: 200
Cells Counted: 200

## 2024-05-21 NOTE — Progress Notes (Signed)
 ------------------------------------------------------------------------------- Attestation signed by Carla Heller, MD at 05/21/24 2107 I personally saw, examined, and evaluated the patient, participating in the key portions of the service. I have reviewed the fellow's note and agree with the documented findings and plan. The note reflects my participation and input in the interval history, physical exam, and complex medical decision making for a high-risk cancer. This visit included intensive monitoring of high-risk medications, given risk of severe myelosuppression, infection, and other toxicities.  Review included CBC, electrolytes and other blood tests, as well as direct assessment of the patient for symptoms suggesting toxicity.   Carla Weaver is a 63 y.o. with PMH HTN and cervical cancer s/p cone biopsy admitted for leukostasis and now with new diagnosis of AML. She received dex 12mg  12/22 as pre-med for pre-phase Cytoxan (not administered) for leukostasis and became increasingly agitated afterwards requiring IVC as patient attempted to leave AMA and did not demonstrate capacity. Patient is now back to baseline and previous agitation has completely resolved. She reports feeling well and appears non-toxic on exam.   Peripheral smear review and notable for 97% blasts, flow consistent with AML. Awaiting myeloid mutation panel results, discussed with path and will be finalized next week but prelim type N NPM1 variant, canonical tier I IDH2 p.(Arg140Gln) variant, plus a loss of function variant in DNMT3A. And a series of FLT3 TKD variants that are not the overtly canonical 835/836 mutations. There is no overt evidence of a FLT3-ITD. Anticipate bone marrow biopsy prior to starting treatment. 2. Corticosteroid-induced psychiatric disorder, resolved - appreciate psych consult. IVC now expired. Subacute CVA - MRI obtained 12/24 more consistent with subacute infarcts. Likely 2/2 to leukocytosis. Will complete  stroke workup with carotid dopplers (notable for less than 50% stenosis), deferring aspirin given thrombocytopenia and most likely etiology of stoke is leukocytosis from AML. Pancytopenia and immunosuppression secondary to disease. Transfusions per protocol. 6.   Antimicrobial prophylaxis per protocol.   I reviewed the following with the patient and family - anticipated hospital stay - plan for bone marrow biopsy for further evaluation - plan to await additional pending diagnostic information which will inform chemotherapy recommendations   Weaver Rozelle, MD PhD  -------------------------------------------------------------------------------  Daily Progress Note   Principal Problem:   Leukocytosis Active Problems:   Hypertension   CVA (cerebral vascular accident)    (CMS-HCC)   Tobacco use    LOS: 5 days   Assessment/Plan: Carla Weaver is an 63 y.o. female who was admitted for leukocytosis and concern for acute leukemia, also found to have acute/subacute infarcts on head imaging.  Plan Summary: Newly diagnosed AML, cytoreduced and awaiting MMP for treatment decisions. Levaquin, Valtrex, mica ppx. Stopped TLS monitoring. Behavioral issues resolved, IVC and sitter discontinued. MRI c/f subacute infarcts, discussed with neuroradiology that they did not visualize sinus thrombus on MRI. D/w neurology that this appears more arterial in nature, and likely microvascular disease from leukocytosis. Lipid panel and A1c grossly unimpressive. Carotid dopplers with bilateral ICA stenosis < 50%. Teams following: psychiatry, neurology, social work, PT/OT.   AML: Presented to OSH 05/15/2024 w a few days of fatigue, dropping things, and a recent fall due to loss of balance. OSH blood work revealed WBC 139.9, Hgb 10.3, Plt 34. CT head performed initially concern for acute/subacute infarcts (see below). No changes in vision or issues with breathing, or other findings concerning for leukostasis. Given  initial concern for leukostasis, discussed the need for leukopheresis w transfusion medicine; deferred due to apheresis staffing availability. Initially planned for  IV high dose cytoxan but given decreasing WBC count and stable neuro exam, monitored on Hydrea . Mildly elevated Cr and elevated LDH on admission, otherwise no signs of TLS or DIC. Flow on admission consistent with AML, HLA typing requested but unable to be performed per team due to self-pay, plan to revisit. Echo complete with LVEF 70%. G6PD WNL. HIV, non acute hepatitis panel nonreactive.  - allopurinol  300mg  daily - ppx: Valtrex, Levaquin, mica [ ]  peripheral blood cytogenetics, FLT3 status, MMP pending - consider LEAP vs. induction inpatient based on FLT/MMP status - line placement pending further work up     Subacute infarcts of the right temporal parietal and left frontal regions: CT head at OSH 05/15/24 reports large R temporoparietal acute/subacute infarct, possible L frontal acute/subacute infarct. Repeat CT head here again c/f subacute infarcts. Initially some concern for cytotoxic edema with mild mass effect and that the lesions may represent mass lesions rather than ischemic infarcts (deferred scheduled dex due to psychosis noted below). On day of admission, 4+/5 strength in L upper extremity and lack of sensation on L thigh initially but resolved on re exam that afternoon. MRI performed 12/25 w subacute infarcts of the right temporal parietal and left frontal regions, likely sequela of extensive microvascular ischemic changes throughout both cerebral hemispheres. Lipid panel w low HDL and mildly high triglycerides; A1C normal. Echo wo evidence of an interatrial flow communication or intrapulmonary shunt by agitated saline study. Carotid US  with left/right ICA stenosis < 50%.  - neuro exam q4 hr  - neurology following, aware of MRI findings  - consider high-intensity statin pending chemotherapy plans - hold ASA for now ISO TCP    Agitation  Corticosteroid-induced psychiatric disorder, resolved: RR called 12/22 evening for agitation; pt declined interventions at that time and was found to have capacity to do so. That night the pt grew more agitated, attempted to leave AMA, and it became clear that patient was suffering from paranoid delusions, severe psychomotor agitation. Psych was consulted, pt was involuntarily committed. Briefly required 4 point restraints. Utilized IV Valium  w good effect. Additional behavioral response the next morning. Psych was called to bedside, and pt was amenable to taking IV haldol to ease agitation after redirection. Briefly on scheduled Haldol 2mg  q6h, stopped due to rapid improvement in symptoms. - nicotine patch ordered  - thiamine PO/NGT 100 mg daily - psych following  [ ]  PEth ordered   Reported dysphagia: pt reports difficulty swallowing pill and unable to swallow multiple pills on 12/22 AM. Reports long standing issue. SLP consulted 12/22 wo concerning findings.  - medications in solutions/IV as able   Constipation, controlled:  - Miralax daily  - senna daily - nutrition consulted   Hx HTN: Reports previously on atenolol  but has not taken anything in years. - CTM  Cervical cancer (in remission): Reports previously diagnosed with cervical cancer 10 years ago; underwent cone biopsy and did not need major surgery, chemotherapy, or radiation.  Cancer-related fatigue: Patient endorses fatigue over the last few days. Primary symptoms are generalized weakness and lethargy.   Immunocompromised status: Patient is immunocompromised secondary to underlying disease. - Antimicrobial prophylaxis as above   Impending electrolyte abnormality: Secondary to chemotherapy and/or IV fluid resuscitation. - Daily electrolyte monitoring - Replete per [facility] guidelines  Pancytopenias:  - Transfuse 1 unit plt for plts < 10k - discussion HGB transfusions w attending due to concern for leukocytosis    Nutrition:  Subjective:  Interval History: NAEON, afebrile. Feeling well today without complaints. Discussed POC, waiting for further information.  10 point ROS otherwise negative except as above in the HPI.   Objective:  Vital signs in last 24 hours: Temp:  [36.2 C (97.2 F)-37 C (98.6 F)] 36.8 C (98.2 F) Pulse:  [63-84] 71 Resp:  [16-18] 16 BP: (121-151)/(51-87) 151/87 MAP (mmHg):  [71-103] 103 SpO2:  [99 %-100 %] 100 %  Intake/Output last 3 shifts: I/O last 3 completed shifts: In: 2140 [P.O.:2140] Out: -   Meds: Current Medications[1]  Physical Exam:  General: Resting, in no apparent distress, sitting in chair  HEENT:  PERRL. No scleral icterus or conjunctival injection. MMM without ulceration, erythema or exudate.  Heart:  RRR. S1, S2. No murmurs, gallops, or rubs. Lungs:  Breathing is unlabored, and patient is speaking full sentences with ease. No stridor. CTAB. No rales, ronchi, or crackles.   Abdomen:  No distention or pain on palpation. Bowel sounds are present and normoactive.  Skin:  No rashes, petechiae or purpura on clothed exam.  Musculoskeletal:  No grossly-evident joint effusions or deformities. Range of motion about the shoulder, elbow, hips and knees is grossly normal. Psychiatric:  Range of affect is appropriate.   Neurologic:  A&O x 4. Mildly diminished strength in LLE. All other strength 5/5. Very mild pronator drift.  Extremities:  Appear well-perfused. No clubbing, edema, or cyanosis. CVAD: none   Labs: Recent Labs    05/19/24 0027 05/20/24 0540 05/21/24 0622  WBC 14.0* 9.0 7.5  NEUTROABS 0.1* 0.0* 0.0*  HGB 9.6* 8.2* 8.3*  HCT 27.7* 23.6* 23.5*  PLT 24* 17* 17*  CREATININE 1.10* 0.99 1.02  BUN 16 13 13   BILITOT 0.3 0.4 0.4  BILIDIR 0.10 0.10 0.20  AST 19 15 16   ALT 12 10 15   ALKPHOS 85 64 66  K 3.6 4.0 4.6  MG 2.0 2.0 2.0  CALCIUM 8.7 8.4* 8.8  NA 141 145 144  CL 111* 111* 109*  CO2 21.0 22.0 25.0  PHOS  5.0 4.5 4.8    Imaging: None new.  Vernell JAYSON Cooks, PA,  05/21/2024  4:25 PM  Hematology/Oncology Department  [facility]  [facility] pager: [phone]        [1] Current Facility-Administered Medications  Medication Dose Route Frequency Provider Last Rate Last Admin   emollient combination no.92 (LUBRIDERM) lotion 1 Application  1 Application Topical Q1H PRN Bhatta, Manasa R, MD       flu vaccine TS 2025-26 (Fluarix,Flulaval,Fluzone) (6mos up)(PF) 45 mcg(15mcgx3)/0.5 mL IM syringe  0.5 mL Intramuscular During hospitalization Hurst, Vernell Craven, PA       haloperidol (HALDOL) oral solution  2 mg Oral Q6H PRN Kram, Lea Fixler, PA       Or   haloperidol LACTATE (HALDOL) injection 2 mg  2 mg Intravenous Q6H PRN Kram, Lea Fixler, PA       levoFLOXacin (LEVAQUIN) oral solution  500 mg Oral daily Moses-Lebron, Alyse Jenna, PA   500 mg at 05/21/24 0637   loperamide (IMODIUM) capsule 2 mg  2 mg Oral Q2H PRN Bhatta, Manasa R, MD       loperamide (IMODIUM) capsule 4 mg  4 mg Oral Once PRN Bhatta, Manasa R, MD       micafungin (MYCAMINE) 100 mg in sodium chloride  0.9 % (NS) 100 mL IVPB-MBP  100 mg Intravenous Q24H Cooks Vernell Craven, PA   Stopped at 05/21/24 1252   nicotine (NICODERM CQ) 21 mg/24 hr patch  1 patch  1 patch Transdermal Daily Tuck, Ellen Jordan, PA       polyethylene glycol (MIRALAX) packet 17 g  17 g Oral BID PRN Ewing Lonni HERO, MD   17 g at 05/20/24 2139   sennosides (SENOKOT) oral syrup  2.5 mL Oral Daily Kram, Lea Fixler, PA   4.4 mg at 05/21/24 9062   sodium chloride  (NS) 0.9 % flush 10 mL  10 mL Intravenous BID Bhatta, Manasa R, MD   10 mL at 05/19/24 2110   sodium chloride  (NS) 0.9 % flush 10 mL  10 mL Intravenous BID Bhatta, Manasa R, MD   10 mL at 05/20/24 0827   sodium chloride  (NS) 0.9 % flush 10 mL  10 mL Intravenous BID Bhatta, Manasa R, MD   10 mL at 05/21/24 9057   thiamine mononitrate (vit B1) tablet 100 mg  100 mg Oral Daily Tuck, Ellen  Jordan, PA   100 mg at 05/21/24 0940   traZODone  (DESYREL ) tablet 50 mg  50 mg Oral Nightly PRN Ewing Lonni HERO, MD   50 mg at 05/20/24 2139   valACYclovir (VALTREX) oral suspension  500 mg Oral Daily Moses-Lebron, Alyse Jenna, PA   500 mg at 05/21/24 0940

## 2024-05-22 NOTE — Care Plan (Signed)
 Shift Summary CBC w/ Differential and Manual Differential resulted with abnormal findings, including high blast percentage and low platelets.  Safety interventions were maintained throughout the shift, with no falls or injuries documented.  Pain remained at 0 and no comfort interventions were required.  Perineal care and hygiene interventions were performed, and IV site remained clean and intact.  Overall, the shift was stable with no new hospital-acquired injuries or infection-related interventions required.   Absence of Hospital-Acquired Illness or Injury: No hospital-acquired injuries or falls occurred during the shift, and safety interventions such as fall reduction and low bed positioning were consistently maintained; side rails remained at 2/4 and all alarms were absent throughout the shift. Bruising was noted on skin assessment but no new abnormalities or interventions were required.   Optimal Comfort and Wellbeing: Pain was consistently assessed and remained at 0, with no interventions required for pain management. Family visited during the shift, and psychosocial status was documented as within defined limits.   Absence of Infection Signs and Symptoms: Perineal care was performed and the peripheral IV site remained clean, dry, and intact with no interventions needed; temperature remained within normal limits.

## 2024-05-23 NOTE — Care Plan (Signed)
 Shift Summary diazePAM  was administered PRN for sleep/anxiety during the shift. Positioning and skin protection interventions were consistently implemented to support tissue health and safety. Infection prevention measures, including aseptic technique and hand hygiene, were maintained throughout the shift. Fall risk interventions such as scheduled toileting and hourly visual checks were performed regularly. Overall, interventions for safety, infection prevention, and fall risk were maintained without documentation of injury or adverse events.  Absence of Hospital-Acquired Illness or Injury: Aseptic technique was consistently maintained and hand hygiene was promoted throughout the shift; cohorting was utilized to support infection prevention. No new device-related skin pressure concerns were documented, and adhesive use was limited to protect skin integrity.

## 2024-05-24 NOTE — Care Plan (Signed)
 Pt AxOx4, family at bedside. No c/o pain or nausea. PRN valium  given for sleep. Pt resting quietly overnight. Pt left the floor a couple times overnight. Protective precautions in place.  Problem: Adult Inpatient Plan of Care Goal: Absence of Hospital-Acquired Illness or Injury Outcome: Shift Focus   Problem: Infection Goal: Absence of Infection Signs and Symptoms Outcome: Shift Focus

## 2024-05-24 NOTE — Nursing Note (Signed)
" ° °  Care Management Reassessment   Estimated Discharge Date: 06/24/2024  Current Discharge Plan: Home with Self Care  Anticipated Changes: See notes below  Coordination of Care and Care Progression Notes: Patient is a new AML treatment plan discussed.   Patient discussed in IDR today. Patient is not medically ready for discharge, and is still in count recovery No new requests/needs from provider team at this time. CM will continue to follow for discharge needs. "

## 2024-05-24 NOTE — Procedures (Signed)
 [facility] Malignant Hematology  Bone Marrow Collection Indications Indications :AML  Pre-Medications: 2 mg IV Versed  Informed consent was obtained, the risks, benefits, and alternatives to the procedure were discussed. All questions were answered.  Time out performed at bedside   Procedure TimeOut  Performed immediately prior to the procedure  Hematopathology Tech was present throughout procedure  Name :Bone Marrow Biopsy + Aspirate   Description:  From the prone position, the right posterior iliac crest was identified. The area was prepped and draped in the usual sterile fashion. 7  ml of 2% lidocaine  was used to anesthetize the skin, subcutaneous tissue and periosteum. Once adequate anesthesia was achieved, a 5-mm incision was made over the posterior iliac crest and a 4 Ranfac bone marrow biopsy needle introduced. Using a gentle twisting motion, the needle was advanced through cortical bone into the marrow space. Once in the marrow space an aspirate was obtained into a syringe containing EDTA: spicules were confirmed. Five ml of aspirate was then drawn into a heparin-containing syringe. The needle was then advanced and a core bone marrow biopsy obtained. Per standard procedure, pressure was applied to the area for 5 minutes and a pressure dressing applied.  Complications Complications:  None.   Specimen(s) Specimen(s):  (1) aspirate into EDTA syringe  (1) aspirate into heparin syringe  (1) core biopsy  Details of Procedure: Technologist Present:Jennifer Spores Spicules Confirmed Bedside:yes Length of Core Biopsy: 1cm Additional Details:None  [employer] PA-C Hematology/Oncology Department  [facility]  Group pager: [phone]

## 2024-05-24 NOTE — Consults (Signed)
 "  Patient is seen in consultation at the request of Armida Glendia Pouch, MD with Oncology/Hematology ([facility]) for possible placement of a central venous access device.  Assessment & Recommendations   Carla Weaver is a 63 y.o. female requiring CVC placement for chemotherapy induction. No history of prior central lines.   Indication for CVC placement: chemotherapy  Type of line to be placed: dual-lumen Powerline catheter  - Anticipated procedure date: 12/30, pending VIR schedule - Sedation plan: Moderate sedation - Keep patient NPO until procedure. - This is a low bleeding risk procedure. INR <= 3.0 Platelets >= 20 - team aware of PLT count; plan to give 1U PLT this AM.  Anticoagulation does not need to be held. - Current labs are sufficient. No new labs are needed. - Iodinated contrast allergy: No. - Antibiotics needed: Ancef 1 g IV (body weight <80 kg)  Pending blood cultures: No. Per [facility] policy, blood cultures must be negative for 48 hours prior to placing a tunneled central venous catheter.  This plan was discussed with Dr. Gerard Gaskins and with the primary consulting team.  Informed Consent Yes; this procedure has been fully reviewed with the patient. This included a discussion of the risks, benefits, and alternatives including but not limited to bleeding, infection, and/or damage to surrounding structures. All questions were answered to their satisfaction, and they would like to proceed with the procedure.  --The patient will accept blood products in an emergent situation. --The patient does not have a Do Not Resuscitate order in effect.  Consent obtained by Rocky Reeve, ACNP, witnessed, and scanned into patient's chart (see media tab).  Thank you for involving us  in the care of this patient. Please page the VIR consult pager [phone]) with further questions, concerns, or if new issues arise.   Subjective   Reason for Line Request: chemo induction   History of Present  Illness: Carla Weaver is a 63 y.o. female with PMH HTN and cervical cancer s/p cone biopsy admitted for leukostasis and now with new diagnosis of AML. She is admitted for chemotherapy induction and IR has been consulted for placement of a central line.   PLT 14 this AM. No AC. No leukocytosis. No pending blood cx. No history of prior central lines.   Review of Systems: Pertinent items are noted in HPI.  Past Medical History: Past Medical History[1]  Past Surgical History: Past Surgical History[2]  Allergies: Allergies[3]   Objective   Physical Exam: Vitals:   05/24/24 0825  BP: 136/65  Pulse: 65  Resp: 18  Temp: 36.3 C (97.3 F)  SpO2: 100%     General: alert, in no acute distress Respiratory: breathing comfortably on room air Neurologic: grossly normal, alert & oriented x3  Airway assessment: Class 1 - Can visualize soft palate, fauces, uvula, and tonsillar pillars  ASA 2 - Patient with mild systemic disease with no functional limitations  Pertinent Labs: Recent Labs    05/22/24 0536 05/23/24 1532 05/24/24 0555  WBC 7.0 8.1 10.8  HGB 8.5* 8.6* 8.3*  HCT 23.3* 24.0* 23.5*  PLT 14* 17* 14*    Recent Labs    05/22/24 0536 05/23/24 1532 05/24/24 0555  NA 142 143 142  K 4.0 4.0 4.0  CL 107 105 107  BUN 11 13 12   CREATININE 0.97 1.04* 1.03*  GLU 95 102 100  Estimated Creatinine Clearance: 59.9 mL/min (A) (based on SCr of 1.03 mg/dL (H)).   No results for input(s): INR, APTT, PTINR, FIBRINOGEN  in  the last 72 hours.  Recent Labs    05/22/24 0536 05/23/24 1532 05/24/24 0555  PROT 6.2 6.8 6.6  ALBUMIN 3.4 3.6 3.6  AST 18 23 22   ALT 14 18 18   ALKPHOS 72 86 87  BILITOT 0.4 0.4 0.4  BILIDIR 0.10 0.10 0.10    No results found for: LABBLOO  Pertinent Imaging: No relevant imaging was available for review.        [1] No past medical history on file. [2] Past Surgical History: Procedure Laterality Date   APPENDECTOMY     CESAREAN  SECTION    [3] Allergies Allergen Reactions   Sulfa (Sulfonamide Antibiotics) Anaphylaxis   Prednisone  Confusion    Psychosis with dexamethsone. Review need for steroids with provider   Decadron [Dexamethasone] Other (See Comments)    Extreme agitation   Ampicillin Rash  "

## 2024-06-23 ENCOUNTER — Ambulatory Visit: Payer: Self-pay | Admitting: Family Medicine

## 2024-07-01 ENCOUNTER — Ambulatory Visit: Payer: Self-pay | Admitting: Family Medicine
